# Patient Record
Sex: Male | Born: 1960 | Hispanic: Refuse to answer | Marital: Married | Smoking: Former smoker
Health system: Southern US, Community
[De-identification: ages and names within clinical notes are randomized; demographics above are authoritative.]

## PROBLEM LIST (undated history)

## (undated) DIAGNOSIS — E785 Hyperlipidemia, unspecified: Secondary | ICD-10-CM

## (undated) DIAGNOSIS — T8859XA Other complications of anesthesia, initial encounter: Secondary | ICD-10-CM

## (undated) DIAGNOSIS — T7840XA Allergy, unspecified, initial encounter: Secondary | ICD-10-CM

## (undated) DIAGNOSIS — R7989 Other specified abnormal findings of blood chemistry: Secondary | ICD-10-CM

## (undated) DIAGNOSIS — I10 Essential (primary) hypertension: Secondary | ICD-10-CM

## (undated) DIAGNOSIS — R9431 Abnormal electrocardiogram [ECG] [EKG]: Secondary | ICD-10-CM

## (undated) DIAGNOSIS — R778 Other specified abnormalities of plasma proteins: Secondary | ICD-10-CM

## (undated) HISTORY — PX: HERNIA REPAIR: SHX51

## (undated) HISTORY — DX: Hyperlipidemia, unspecified: E78.5

## (undated) HISTORY — PX: KNEE CARTILAGE SURGERY: SHX688

## (undated) HISTORY — DX: Allergy, unspecified, initial encounter: T78.40XA

## (undated) HISTORY — PX: SPINE SURGERY: SHX786

## (undated) HISTORY — PX: WISDOM TOOTH EXTRACTION: SHX21

---

## 2005-02-10 HISTORY — PX: OTHER SURGICAL HISTORY: SHX169

## 2015-12-03 ENCOUNTER — Encounter (HOSPITAL_COMMUNITY): Payer: Self-pay

## 2015-12-03 ENCOUNTER — Emergency Department (HOSPITAL_COMMUNITY)
Admission: EM | Admit: 2015-12-03 | Discharge: 2015-12-04 | Disposition: A | Discharge status: Home / Self Care | Payer: TRICARE For Life (TFL) | Attending: Emergency Medicine | Admitting: Emergency Medicine

## 2015-12-03 ENCOUNTER — Emergency Department (HOSPITAL_COMMUNITY): Payer: TRICARE For Life (TFL)

## 2015-12-03 DIAGNOSIS — I10 Essential (primary) hypertension: Secondary | ICD-10-CM | POA: Diagnosis not present

## 2015-12-03 DIAGNOSIS — S30811A Abrasion of abdominal wall, initial encounter: Secondary | ICD-10-CM | POA: Insufficient documentation

## 2015-12-03 DIAGNOSIS — Y939 Activity, unspecified: Secondary | ICD-10-CM | POA: Insufficient documentation

## 2015-12-03 DIAGNOSIS — Y999 Unspecified external cause status: Secondary | ICD-10-CM | POA: Diagnosis not present

## 2015-12-03 DIAGNOSIS — W1789XA Other fall from one level to another, initial encounter: Secondary | ICD-10-CM | POA: Diagnosis not present

## 2015-12-03 DIAGNOSIS — Y92009 Unspecified place in unspecified non-institutional (private) residence as the place of occurrence of the external cause: Secondary | ICD-10-CM | POA: Insufficient documentation

## 2015-12-03 DIAGNOSIS — W19XXXA Unspecified fall, initial encounter: Secondary | ICD-10-CM

## 2015-12-03 DIAGNOSIS — M25571 Pain in right ankle and joints of right foot: Secondary | ICD-10-CM

## 2015-12-03 HISTORY — DX: Essential (primary) hypertension: I10

## 2015-12-03 HISTORY — DX: Abnormal electrocardiogram (ECG) (EKG): R94.31

## 2015-12-03 HISTORY — DX: Other specified abnormalities of plasma proteins: R77.8

## 2015-12-03 HISTORY — DX: Other specified abnormal findings of blood chemistry: R79.89

## 2015-12-03 NOTE — ED Triage Notes (Signed)
Pt states she fell through 14 ft ceiling landing on  Right ankle/hill; pt c/o of right back pain, right butt pain and right ankle pain; pt c/o pain at 5/10 on arrival; pt denies any other injures or hitting head; Pt a&ox 4 on arrival; pt speaking in full and complete sentences;

## 2015-12-04 ENCOUNTER — Emergency Department (HOSPITAL_COMMUNITY): Payer: TRICARE For Life (TFL)

## 2015-12-04 LAB — URINALYSIS, ROUTINE W REFLEX MICROSCOPIC
BILIRUBIN URINE: NEGATIVE
Glucose, UA: NEGATIVE mg/dL
HGB URINE DIPSTICK: NEGATIVE
KETONES UR: NEGATIVE mg/dL
Leukocytes, UA: NEGATIVE
NITRITE: NEGATIVE
Protein, ur: NEGATIVE mg/dL
Specific Gravity, Urine: 1.023 (ref 1.005–1.030)
pH: 6 (ref 5.0–8.0)

## 2015-12-04 MED ORDER — HYDROCODONE-ACETAMINOPHEN 5-325 MG PO TABS: 1.0000 | ORAL_TABLET | ORAL | 0 refills | Status: DC | PRN

## 2015-12-04 NOTE — ED Provider Notes (Signed)
[facility]-EMERGENCY DEPT Provider Note   CSN: [id number] Arrival date & time: 12/03/15  2210     History   Chief Complaint Chief Complaint  Patient presents with  . Fall  . Ankle Injury    HPI Gregory Hunter is a 55 y.o. male.  Patient with history of HTN presents after a fall at home where he was in his attic and he fell between the rafter, through the ceiling, with impact in a standing position to the floor approximately 14 feet below. He complains of right ankle pain, right flank abrasion and sharp, right buttock pain. He did not hit his head. No chest, neck or abdominal pain. He is not anticoagulated.   The history is provided by the patient. No language interpreter was used.  Fall  Pertinent negatives include no chest pain, no abdominal pain, no headaches and no shortness of breath.  Ankle Injury  Pertinent negatives include no chest pain, no abdominal pain, no headaches and no shortness of breath.    Past Medical History:  Diagnosis Date  . Abnormal EKG   . Elevated troponin   . Hypertension     There are no active problems to display for this patient.   Past Surgical History:  Procedure Laterality Date  . back fusion  2007       Home Medications    Prior to Admission medications   Medication Sig Start Date End Date Taking? Authorizing Provider  amLODipine (NORVASC) 10 MG tablet Take 10 mg by mouth daily.   Yes Historical Provider, MD  diclofenac (VOLTAREN) 75 MG EC tablet Take 75 mg by mouth 2 (two) times daily.   Yes Historical Provider, MD  losartan (COZAAR) 50 MG tablet Take 50 mg by mouth daily.   Yes Historical Provider, MD    Family History No family history on file.  Social History Social History  Substance Use Topics  . Smoking status: Never Smoker  . Smokeless tobacco: Not on file  . Alcohol use Yes     Allergies   Review of patient's allergies indicates no known allergies.   Review of Systems Review of Systems  Constitutional: Negative  for chills and fever.  Respiratory: Negative.  Negative for shortness of breath.   Cardiovascular: Negative.  Negative for chest pain.  Gastrointestinal: Negative.  Negative for abdominal pain and nausea.  Genitourinary: Positive for flank pain. Negative for hematuria.  Musculoskeletal: Negative for back pain and neck pain.       See HPI.  Skin: Positive for wound (Right flank abrasion).  Neurological: Negative.  Negative for syncope, light-headedness and headaches.     Physical Exam Updated Vital Signs BP 136/90 (BP Location: Right Arm)   Pulse 74   Temp 98 F (36.7 C) (Oral)   Resp 20   Ht 5\' 11"  (1.803 m)   Wt 90.7 kg   SpO2 98%   BMI 27.89 kg/m   Physical Exam  Constitutional: He is oriented to person, place, and time. He appears well-developed and well-nourished.  HENT:  Head: Normocephalic and atraumatic.  Neck: Normal range of motion. Neck supple.  Cardiovascular: Normal rate.   Pulmonary/Chest: Effort normal. He exhibits no tenderness.  Abdominal: Soft. Bowel sounds are normal. There is no tenderness. There is no rebound and no guarding.  Musculoskeletal: Normal range of motion.  Right ankle is minimally swollen and is without deformity. There is increased pain with joint stability testing. Posterior tendon intact. No midline or para-lumbar tenderness.   Neurological: He is  alert and oriented to person, place, and time. He exhibits normal muscle tone. Coordination normal.  Skin: Skin is warm and dry. No rash noted.  Superficial abrasion right flank that is tender.   Psychiatric: He has a normal mood and affect.     ED Treatments / Results  Labs (all labs ordered are listed, but only abnormal results are displayed) Labs Reviewed  URINALYSIS, ROUTINE W REFLEX MICROSCOPIC (NOT AT [facility])    EKG  EKG Interpretation None       Radiology Dg Ankle Complete Right  Result Date: 12/03/2015 CLINICAL DATA:  Larey Seat 14 feet from ceiling tonight. Lateral ankle pain  and swelling. EXAM: RIGHT ANKLE - COMPLETE 3+ VIEW COMPARISON:  None. FINDINGS: No fracture deformity nor dislocation. The ankle mortise appears congruent and the tibiofibular syndesmosis intact. No destructive bony lesions. Mild lateral ankle soft tissue swelling without subcutaneous gas or radiopaque foreign bodies. IMPRESSION: Soft tissue swelling, no acute osseous process. Electronically Signed   By: Awilda Metro M.D.   On: 12/03/2015 23:28    Procedures Procedures (including critical care time)  Medications Ordered in ED Medications - No data to display   Initial Impression / Assessment and Plan / ED Course  I have reviewed the triage vital signs and the nursing notes.  Pertinent labs & imaging results that were available during my care of the patient were reviewed by me and considered in my medical decision making (see chart for details).  Clinical Course    Patient presents after approximate 14 foot fall from ceiling while in his attic. Complains of right ankle pain, right buttock pain and abrasion to right flank.   UA performed to r/o hematuria and is negative. Plain film ankle is negative, but given injury and tenderness, suspect occult fracture is possible. ASO splint and crutches provided and instructions to be non-weight bearing until evaluated by orthopedics. Lumbar plain films are negative for compression fractures or other acute abnormalities.   Patient has declined pain medication here. Will discharge home with pain management and ortho referral.   Final Clinical Impressions(s) / ED Diagnoses   Final diagnoses:  None   1. Fall 2. Ankle pain, right 3. Abrasion right flank  New Prescriptions New Prescriptions   No medications on file     Elpidio Anis, Cordelia Poche 12/04/15 0323    Dione Booze, MD 12/04/15 [phone]

## 2015-12-04 NOTE — ED Notes (Signed)
Patient transported to X-ray 

## 2015-12-04 NOTE — ED Notes (Signed)
Pt back from x-ray.

## 2016-04-03 ENCOUNTER — Telehealth: Payer: Self-pay | Admitting: Cardiology

## 2016-04-03 NOTE — Telephone Encounter (Signed)
Records received from [facility] for apt on 04/21/16 with Dr Antoine PocheHochrein. CN

## 2016-04-11 ENCOUNTER — Encounter: Payer: Self-pay | Admitting: Cardiology

## 2016-04-19 NOTE — Progress Notes (Signed)
Cardiology Office Note   Date:  04/21/2016   ID:  Gregory Hunter, DOB 03/19/1960, MRN [id number]  PCP:  No PCP Per Patient  Cardiologist:   Rollene RotundaJames Rexton Greulich, MD  Referring:  Self  No chief complaint on file.     History of Present Illness: Gregory Hunter is a 56 y.o. male who presents for abnormal EKG. He has had this noted when he was in the [employer]. At some point along the way he did have a cardiac catheterization which he says was normal.  He did have a treadmill perfusion study in December 2008 demonstrated normal perfusion is a normal EF. Echocardiogram showed mild LVH that time. He had a negative POET (Plain Old Exercise Treadmill) in 2012. Echocardiogram has demonstrated some mild mitral regurgitation in 2012.  He is quite active and athletic. With this he denies any cardiovascular symptoms. The patient denies any new symptoms such as chest discomfort, neck or arm discomfort. There has been no new shortness of breath, PND or orthopnea. There have been no reported palpitations, presyncope or syncope.  Of note he also says he's had elevated troponins in the past with an unclear reason and perhaps hypertension.   Past Medical History:  Diagnosis Date  . Abnormal EKG   . Elevated troponin   . Hypertension     Past Surgical History:  Procedure Laterality Date  . back fusion  2007     Current Outpatient Prescriptions  Medication Sig Dispense Refill  . amLODipine (NORVASC) 10 MG tablet Take 1 tablet (10 mg total) by mouth daily. 90 tablet 3  . diclofenac (VOLTAREN) 75 MG EC tablet Take 75 mg by mouth 2 (two) times daily.    Marland Kitchen. losartan (COZAAR) 50 MG tablet Take 1 tablet (50 mg total) by mouth daily. 90 tablet 3   No current facility-administered medications for this visit.     Allergies:   Patient has no known allergies.    Social History:  The patient  reports that he has quit smoking. His smoking use included Cigarettes. He has never used smokeless tobacco. He reports that he  drinks alcohol. He reports that he does not use drugs.   Family History:  The patient's family history is not on file.    ROS:  Please see the history of present illness.   Otherwise, review of systems are positive for none.   All other systems are reviewed and negative.    PHYSICAL EXAM: VS:  BP (!) 131/92 (BP Location: Left Arm, Patient Position: Sitting, Cuff Size: Normal)   Pulse 62   Ht 5\' 11"  (1.803 m)   Wt 206 lb 9.6 oz (93.7 kg)   BMI 28.81 kg/m  , BMI Body mass index is 28.81 kg/m. GENERAL:  Well appearing HEENT:  Pupils equal round and reactive, fundi not visualized, oral mucosa unremarkable NECK:  No jugular venous distention, waveform within normal limits, carotid upstroke brisk and symmetric, no bruits, no thyromegaly LYMPHATICS:  No cervical, inguinal adenopathy LUNGS:  Clear to auscultation bilaterally BACK:  No CVA tenderness CHEST:  Unremarkable HEART:  PMI not displaced or sustained,S1 and S2 within normal limits, no S3, no S4, no clicks, no rubs, no murmurs ABD:  Flat, positive bowel sounds normal in frequency in pitch, no bruits, no rebound, no guarding, no midline pulsatile mass, no hepatomegaly, no splenomegaly EXT:  2 plus pulses throughout, no edema, no cyanosis no clubbing SKIN:  No rashes no nodules NEURO:  Cranial nerves II through XII grossly  intact, motor grossly intact throughout PSYCH:  Cognitively intact, oriented to person place and time    EKG:  EKG is ordered today. The ekg ordered today demonstrates sinus rhythm, rate 62, axis within normal limits, intervals within normal limits, inferolateral T wave inversions unchanged from previous.   Recent Labs: No results found for requested labs within last 8760 hours.    Lipid Panel No results found for: CHOL, TRIG, HDL, CHOLHDL, VLDL, LDLCALC, LDLDIRECT    Wt Readings from Last 3 Encounters:  04/21/16 206 lb 9.6 oz (93.7 kg)  12/03/15 200 lb (90.7 kg)      Other studies  Reviewed: Additional studies/ records that were reviewed today include: Outside cardiology records. Review of the above records demonstrates:  Please see elsewhere in the note.     ASSESSMENT AND PLAN:  Hypertension:  BP is borderline.  He will keep a BP diary.  For now he will continue the meds as listed.   Hyperlipidemia:   His LDL last year was 175. We discussed this. He's had a pursue a Mediterranean diet and come back in 2 months for a fasting lipid profile. If he still elevated will discuss taking statin although likely to want to be guided by CT for coronary calcium.  Abnormal EKG:  This seems to be his baseline. He's had extensive workup. This may be related to LVH. No further evaluation is indicated. He can continue with aggressive primary risk reduction.     Current medicines are reviewed at length with the patient today.  The patient does not have concerns regarding medicines.  The following changes have been made:  no change  Labs/ tests ordered today include:   Orders Placed This Encounter  Procedures  . Hepatic function panel  . Lipid panel     Disposition:   FU with in two years.     Signed, Rollene Rotunda, MD  04/21/2016 4:46 PM    [facility]

## 2016-04-21 ENCOUNTER — Encounter: Payer: Self-pay | Admitting: Cardiology

## 2016-04-21 ENCOUNTER — Other Ambulatory Visit: Payer: Self-pay

## 2016-04-21 ENCOUNTER — Ambulatory Visit (INDEPENDENT_AMBULATORY_CARE_PROVIDER_SITE_OTHER): Admitting: Cardiology

## 2016-04-21 VITALS — BP 131/92 | HR 62 | Ht 71.0 in | Wt 206.6 lb

## 2016-04-21 DIAGNOSIS — Z79899 Other long term (current) drug therapy: Secondary | ICD-10-CM

## 2016-04-21 DIAGNOSIS — E785 Hyperlipidemia, unspecified: Secondary | ICD-10-CM

## 2016-04-21 DIAGNOSIS — R9431 Abnormal electrocardiogram [ECG] [EKG]: Secondary | ICD-10-CM | POA: Diagnosis not present

## 2016-04-21 MED ORDER — AMLODIPINE BESYLATE 10 MG PO TABS: 10.0000 mg | ORAL_TABLET | Freq: Every day | ORAL | 3 refills | Status: DC

## 2016-04-21 MED ORDER — LOSARTAN POTASSIUM 50 MG PO TABS: 50.0000 mg | ORAL_TABLET | Freq: Every day | ORAL | 3 refills | Status: DC

## 2016-04-21 NOTE — Patient Instructions (Addendum)
Medication Instructions:  Continue current medications  Labwork: Fasting Lipids Liver in 2 Months  Testing/Procedures: None Ordered  Follow-Up: Your physician wants you to follow-up in: 2 Years. You will receive a reminder letter in the mail two months in advance. If you don't receive a letter, please call our office to schedule the follow-up appointment.   Any Other Special Instructions Will Be Listed Below (If Applicable).   If you need a refill on your cardiac medications before your next appointment, please call your pharmacy.

## 2016-04-23 NOTE — Addendum Note (Signed)
Addended by: Beryle QuantBENSHINE, Cheronda Erck L on: 04/23/2016 08:31 AM   Modules accepted: Orders

## 2016-11-10 ENCOUNTER — Other Ambulatory Visit: Payer: Self-pay | Admitting: Physical Medicine and Rehabilitation

## 2016-11-10 DIAGNOSIS — M542 Cervicalgia: Secondary | ICD-10-CM

## 2016-11-20 ENCOUNTER — Ambulatory Visit
Admission: RE | Admit: 2016-11-20 | Discharge: 2016-11-20 | Disposition: A | Discharge status: Home / Self Care | Source: Ambulatory Visit | Attending: Physical Medicine and Rehabilitation | Admitting: Physical Medicine and Rehabilitation

## 2016-11-20 DIAGNOSIS — M542 Cervicalgia: Secondary | ICD-10-CM

## 2016-11-20 MED ORDER — TRIAMCINOLONE ACETONIDE 40 MG/ML IJ SUSP (RADIOLOGY)
60.0000 mg | Freq: Once | INTRAMUSCULAR | Status: AC
  Administered 2016-11-20: 60 mg via EPIDURAL

## 2016-11-20 MED ORDER — IOPAMIDOL (ISOVUE-M 300) INJECTION 61%
1.0000 mL | Freq: Once | INTRAMUSCULAR | Status: AC | PRN
  Administered 2016-11-20: 1 mL via EPIDURAL

## 2016-11-20 NOTE — Discharge Instructions (Signed)

## 2017-03-13 ENCOUNTER — Other Ambulatory Visit: Payer: Self-pay | Admitting: Physical Medicine and Rehabilitation

## 2017-03-13 DIAGNOSIS — M542 Cervicalgia: Secondary | ICD-10-CM

## 2017-06-16 ENCOUNTER — Ambulatory Visit: Admitting: Cardiology

## 2017-06-23 ENCOUNTER — Encounter: Payer: Self-pay | Admitting: Cardiology

## 2017-07-08 ENCOUNTER — Telehealth: Payer: Self-pay | Admitting: Cardiology

## 2017-07-08 NOTE — Telephone Encounter (Signed)
Patient made aware that he has pending fasting labs. He will come tomorrow to have these done.

## 2017-07-08 NOTE — Telephone Encounter (Signed)
New Message:       Pt is wondering if he is needing labs done before his appt on 07/16/17

## 2017-07-09 ENCOUNTER — Other Ambulatory Visit: Payer: Self-pay | Admitting: *Deleted

## 2017-07-09 DIAGNOSIS — Z79899 Other long term (current) drug therapy: Secondary | ICD-10-CM

## 2017-07-09 DIAGNOSIS — E785 Hyperlipidemia, unspecified: Secondary | ICD-10-CM

## 2017-07-09 LAB — HEPATIC FUNCTION PANEL
ALBUMIN: 4.5 g/dL (ref 3.5–5.5)
ALK PHOS: 50 IU/L (ref 39–117)
ALT: 21 IU/L (ref 0–44)
AST: 20 IU/L (ref 0–40)
BILIRUBIN, DIRECT: 0.14 mg/dL (ref 0.00–0.40)
Bilirubin Total: 0.7 mg/dL (ref 0.0–1.2)
Total Protein: 6.6 g/dL (ref 6.0–8.5)

## 2017-07-09 LAB — LIPID PANEL
CHOL/HDL RATIO: 3.6 ratio (ref 0.0–5.0)
Cholesterol, Total: 200 mg/dL — ABNORMAL HIGH (ref 100–199)
HDL: 56 mg/dL (ref >39)
LDL Calculated: 122 mg/dL — ABNORMAL HIGH (ref 0–99)
TRIGLYCERIDES: 108 mg/dL (ref 0–149)
VLDL Cholesterol Cal: 22 mg/dL (ref 5–40)

## 2017-07-09 NOTE — Progress Notes (Signed)
Pt did get lab work when it was order 1 year ago, pt came in today to get blood work drawn with the same lab slip given to him on 03/18, lab had to be reorder

## 2017-07-13 ENCOUNTER — Ambulatory Visit: Admitting: Cardiology

## 2017-07-13 ENCOUNTER — Telehealth: Payer: Self-pay | Admitting: Cardiology

## 2017-07-13 DIAGNOSIS — R0602 Shortness of breath: Secondary | ICD-10-CM

## 2017-07-13 NOTE — Telephone Encounter (Signed)
Pt aware of his blood work Coronary calcium schedule for Thursday June 6th @ 8:15 am, pt appt change to Monday June 10th @ 8:00

## 2017-07-13 NOTE — Telephone Encounter (Signed)
New Message   Patient returning your call from Friday

## 2017-07-13 NOTE — Telephone Encounter (Signed)
-----   Message from Rollene RotundaJames Hochrein, MD sent at 07/10/2017  1:02 PM EDT ----- LDL is still elevated.  I think a coronary calcium score would be helpful to guide therapy.  Call Mr. Jose PersiaKivi with the results.

## 2017-07-16 ENCOUNTER — Other Ambulatory Visit: Payer: Self-pay | Admitting: Cardiology

## 2017-07-16 ENCOUNTER — Ambulatory Visit (INDEPENDENT_AMBULATORY_CARE_PROVIDER_SITE_OTHER)
Admission: RE | Admit: 2017-07-16 | Discharge: 2017-07-16 | Disposition: A | Discharge status: Home / Self Care | Payer: Self-pay | Source: Ambulatory Visit | Attending: Cardiology | Admitting: Cardiology

## 2017-07-16 ENCOUNTER — Ambulatory Visit: Admitting: Cardiology

## 2017-07-16 DIAGNOSIS — R0602 Shortness of breath: Secondary | ICD-10-CM

## 2017-07-18 NOTE — Progress Notes (Signed)
Cardiology Office Note   Date:  07/20/2017   ID:  Gregory Hunter, DOB [birth year], MRN [id number]  PCP:  Patient, No Pcp Per  Cardiologist:   Rollene Rotunda, MD     Chief Complaint  Patient presents with  . Abnormal ECG      History of Present Illness: Gregory Hunter is a 57 y.o. male who presents for abnormal EKG. He has had this noted when he was in the [employer]. At some point along the way he did have a cardiac catheterization which he says was normal.  He did have a treadmill perfusion study in December 2008 demonstrated normal perfusion is a normal EF. Echocardiogram showed mild LVH that time. He had a negative POET (Plain Old Exercise Treadmill) in 2012. Echocardiogram has demonstrated some mild mitral regurgitation in 2012.   I sent him for a coronary calcium last week that was 140 and at the 82nd%.  He returns to discuss this.    Since I last saw him he has done well.  He exercises at least 75 minutes vigorously weekly.  The patient denies any new symptoms such as chest discomfort, neck or arm discomfort. There has been no new shortness of breath, PND or orthopnea. There have been no reported palpitations, presyncope or syncope.   Past Medical History:  Diagnosis Date  . Abnormal EKG   . Elevated troponin   . Hypertension     Past Surgical History:  Procedure Laterality Date  . back fusion  2007     Current Outpatient Medications  Medication Sig Dispense Refill  . amLODipine (NORVASC) 10 MG tablet TAKE 1 TABLET DAILY 90 tablet 3  . diclofenac (VOLTAREN) 75 MG EC tablet Take 75 mg by mouth 2 (two) times daily.    Marland Kitchen losartan (COZAAR) 50 MG tablet Take 1 tablet (50 mg total) by mouth daily. 90 tablet 3  . pravastatin (PRAVACHOL) 80 MG tablet Take 1 tablet (80 mg total) by mouth daily. 90 tablet 3   No current facility-administered medications for this visit.     Allergies:   Patient has no known allergies.    ROS:  Please see the history of present illness.   Otherwise,  review of systems are positive for none.   All other systems are reviewed and negative.    PHYSICAL EXAM: VS:  BP 110/80   Pulse 73   Ht 5' 10.5" (1.791 m)   Wt 202 lb (91.6 kg)   BMI 28.57 kg/m  , BMI Body mass index is 28.57 kg/m.  GENERAL:  Well appearing NECK:  No jugular venous distention, waveform within normal limits, carotid upstroke brisk and symmetric, no bruits, no thyromegaly LUNGS:  Clear to auscultation bilaterally CHEST:  Unremarkable HEART:  PMI not displaced or sustained,S1 and S2 within normal limits, no S3, no S4, no clicks, no rubs, no murmurs ABD:  Flat, positive bowel sounds normal in frequency in pitch, no bruits, no rebound, no guarding, no midline pulsatile mass, no hepatomegaly, no splenomegaly EXT:  2 plus pulses throughout, no edema, no cyanosis no clubbing   EKG:  EKG is  ordered today. The ekg ordered today demonstrates sinus rhythm, rate 73 , axis within normal limits, intervals within normal limits, inferolateral T wave inversions less pronounced than previous.   Recent Labs: 07/09/2017: ALT 21    Lipid Panel    Component Value Date/Time   CHOL 200 (H) 07/09/2017 1031   TRIG 108 07/09/2017 1031   HDL 56 07/09/2017 1031  CHOLHDL 3.6 07/09/2017 1031   LDLCALC 122 (H) 07/09/2017 1031      Wt Readings from Last 3 Encounters:  07/20/17 202 lb (91.6 kg)  04/21/16 206 lb 9.6 oz (93.7 kg)  12/03/15 200 lb (90.7 kg)      Other studies Reviewed: Additional studies/ records that were reviewed today include: Coronary calcium CT Review of the above records demonstrates:  As above   ASSESSMENT AND PLAN:   Elevated coronary calcium:   His MESA score is 10.5% with the coronary calcium added.  He is now taking Pravachol 40 mg and as he is not at target heart rate I will increase this to 80 mg and check a lipid profile in 10 weeks.   Hypertension:  BP is at target.  No change in therapy.   Hyperlipidemia:   His LDL was 122 with an HDL of 56.   This will be treated as above.    last year was 175.   Abnormal EKG:    T wave inversion is less pronounced.  No change in therapy.   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  I will follow this with imaging in one year.         Current medicines are reviewed at length with the patient today.  The patient does not have concerns regarding medicines.  The following changes have been made:  As above  Labs/ tests ordered today include:     Orders Placed This Encounter  Procedures  . Lipid panel  . EXERCISE TOLERANCE TEST (ETT)  . EKG 12-Lead     Disposition:   FU with me in one year.      Signed, Rollene RotundaJames Ronith Berti, MD  07/20/2017 8:22 AM    [facility]

## 2017-07-20 ENCOUNTER — Encounter: Payer: Self-pay | Admitting: Cardiology

## 2017-07-20 ENCOUNTER — Ambulatory Visit (INDEPENDENT_AMBULATORY_CARE_PROVIDER_SITE_OTHER): Admitting: Cardiology

## 2017-07-20 VITALS — BP 110/80 | HR 73 | Ht 70.5 in | Wt 202.0 lb

## 2017-07-20 DIAGNOSIS — I10 Essential (primary) hypertension: Secondary | ICD-10-CM | POA: Diagnosis not present

## 2017-07-20 DIAGNOSIS — R931 Abnormal findings on diagnostic imaging of heart and coronary circulation: Secondary | ICD-10-CM | POA: Diagnosis not present

## 2017-07-20 DIAGNOSIS — E785 Hyperlipidemia, unspecified: Secondary | ICD-10-CM

## 2017-07-20 DIAGNOSIS — R9431 Abnormal electrocardiogram [ECG] [EKG]: Secondary | ICD-10-CM

## 2017-07-20 MED ORDER — PRAVASTATIN SODIUM 80 MG PO TABS: 80.0000 mg | ORAL_TABLET | Freq: Every day | ORAL | 3 refills | Status: DC

## 2017-07-20 NOTE — Patient Instructions (Signed)
Medication Instructions:  INCREASE- Pravastatin 80 mg daily  If you need a refill on your cardiac medications before your next appointment, please call your pharmacy.  Labwork: Fasting Lipids in 10 weeks HERE IN OUR OFFICE AT [facility]  Take the provided lab slips with you to the lab for your blood draw.   You will need to fast. DO NOT EAT OR DRINK PAST MIDNIGHT.   Testing/Procedures: Your physician has requested that you have an exercise tolerance test. For further information please visit [email or web address] Please also follow instruction sheet, as given.  Follow-Up: Your physician wants you to follow-up in: 1 Year. You should receive a reminder letter in the mail two months in advance. If you do not receive a letter, please call our office [phone].      [facility] you [facility] choosing [facility]!!

## 2017-07-24 ENCOUNTER — Telehealth (HOSPITAL_COMMUNITY): Payer: Self-pay

## 2017-07-24 NOTE — Telephone Encounter (Signed)
Encounter complete. 

## 2017-07-29 ENCOUNTER — Ambulatory Visit (HOSPITAL_COMMUNITY)
Admission: RE | Admit: 2017-07-29 | Discharge: 2017-07-29 | Disposition: A | Discharge status: Home / Self Care | Source: Ambulatory Visit | Attending: Cardiovascular Disease | Admitting: Cardiovascular Disease

## 2017-07-29 DIAGNOSIS — R9431 Abnormal electrocardiogram [ECG] [EKG]: Secondary | ICD-10-CM | POA: Diagnosis not present

## 2017-07-29 LAB — EXERCISE TOLERANCE TEST
CHL CUP MPHR: 164 {beats}/min
CHL CUP RESTING HR STRESS: 67 {beats}/min
CSEPPHR: 164 {beats}/min
Estimated workload: 13.4 METS
Exercise duration (min): 12 min
Exercise duration (sec): 0 s
Percent HR: 100 %
RPE: 16

## 2017-09-25 ENCOUNTER — Encounter: Payer: Self-pay | Admitting: Cardiology

## 2017-10-07 ENCOUNTER — Telehealth: Payer: Self-pay

## 2017-10-07 NOTE — Telephone Encounter (Signed)
Spoke to patient Dr.Hochrein advised ok to start taking Zetia that Dr.Patterson prescribed.Stated he will fill prescription today.

## 2018-02-08 MED ORDER — AMLODIPINE BESYLATE 10 MG PO TABS: 10.0000 mg | ORAL_TABLET | Freq: Every day | ORAL | 2 refills | Status: DC

## 2018-02-08 MED ORDER — LOSARTAN POTASSIUM 50 MG PO TABS: 50.0000 mg | ORAL_TABLET | Freq: Every day | ORAL | 2 refills | Status: DC

## 2018-05-11 ENCOUNTER — Other Ambulatory Visit: Payer: Self-pay

## 2018-05-11 MED ORDER — PRAVASTATIN SODIUM 80 MG PO TABS: 80.0000 mg | ORAL_TABLET | Freq: Every day | ORAL | 0 refills | Status: DC

## 2018-05-11 MED ORDER — AMLODIPINE BESYLATE 10 MG PO TABS: 10.0000 mg | ORAL_TABLET | Freq: Every day | ORAL | 0 refills | Status: DC

## 2018-05-11 MED ORDER — EZETIMIBE 10 MG PO TABS: 10.0000 mg | ORAL_TABLET | Freq: Every day | ORAL | 0 refills | Status: DC

## 2018-05-11 MED ORDER — LOSARTAN POTASSIUM 50 MG PO TABS: 50.0000 mg | ORAL_TABLET | Freq: Every day | ORAL | 0 refills | Status: DC

## 2018-05-11 NOTE — Telephone Encounter (Signed)
Refill Amlodipine, Losartan, Pravastatin and Zetia

## 2018-05-17 ENCOUNTER — Other Ambulatory Visit: Payer: Self-pay

## 2018-05-21 ENCOUNTER — Telehealth: Payer: Self-pay | Admitting: Cardiology

## 2018-05-21 NOTE — Telephone Encounter (Signed)
Smartphone/ my chart/ pre reg completed °

## 2018-05-23 NOTE — Progress Notes (Signed)
Virtual Visit via Video Note   This visit type was conducted due to national recommendations for restrictions regarding the COVID-19 Pandemic (e.g. social distancing) in an effort to limit this patient's exposure and mitigate transmission in our community.  Due to his co-morbid illnesses, this patient is at least at moderate risk for complications without adequate follow up.  This format is felt to be most appropriate for this patient at this time.  All issues noted in this document were discussed and addressed.  A limited physical exam was performed with this format.  Please refer to the patient's chart for his consent to telehealth for [facility].   Evaluation Performed:  Follow-up visit  Date:  05/24/2018   ID:  Gregory Hurtric Matney, DOB 03/14/1960, MRN [id number]  Patient Location: Home  Provider Location: Home  PCP:  Jarome MatinPaterson, Daniel, MD  Cardiologist:  Rollene RotundaJames Zyionna Pesce, MD  Electrophysiologist:  None   Chief Complaint:  Fatigue  History of Present Illness:    Gregory Hunter is a 58 y.o. male who presents via audio/video conferencing for a telehealth visit today.    He presents for follow up of an abnormal EKG. He has had this noted when he was in the [employer]. At some point along the way he did have a cardiac catheterization which he says was normal.  He did have a treadmill perfusion study in December 2008 demonstrated normal perfusion is a normal EF. Echocardiogram showed mild LVH that time. He had a negative POET (Plain Old Exercise Treadmill) in 2012. Echocardiogram has demonstrated some mild mitral regurgitation in 2012.   I sent him for a coronary calcium that was 140 and at the 82nd%.  He also has slight aortic enlargement.     Since I last saw him he has done quite well.  He was out biking recently and he is noticed that he gets lightheaded when he is done exercising.  He does not have chest pressure, neck or arm discomfort.  He does not have palpitations, presyncope or syncope.  He says  immediately after his been exercising he will feel lightheaded like when he is walking up the stairs any finishes or when he is getting off his bike.  He also sleeps more than he used to and wants to nap every day.    Since I last saw him he has done well.  He exercises at least 75 minutes vigorously weekly.  The patient denies any new symptoms such as chest discomfort, neck or arm discomfort. There has been no new shortness of breath, PND or orthopnea. There have been no reported palpitations, presyncope or syncope.   The patient does not have symptoms concerning for COVID-19 infection (fever, chills, cough, or new shortness of breath).    Past Medical History:  Diagnosis Date  . Abnormal EKG   . Elevated troponin   . Hypertension    Past Surgical History:  Procedure Laterality Date  . back fusion  2007     Current Meds  Medication Sig  . amLODipine (NORVASC) 10 MG tablet Take 1 tablet (10 mg total) by mouth daily.  . diclofenac (VOLTAREN) 75 MG EC tablet Take 75 mg by mouth 2 (two) times daily.  Marland Kitchen. ezetimibe (ZETIA) 10 MG tablet Take 1 tablet (10 mg total) by mouth daily.  Marland Kitchen. losartan (COZAAR) 50 MG tablet Take 1 tablet (50 mg total) by mouth daily.  . pravastatin (PRAVACHOL) 80 MG tablet Take 1 tablet (80 mg total) by mouth daily.  . [DISCONTINUED]  amLODipine (NORVASC) 10 MG tablet Take 1 tablet (10 mg total) by mouth daily.  . [DISCONTINUED] ezetimibe (ZETIA) 10 MG tablet Take 1 tablet (10 mg total) by mouth daily.  . [DISCONTINUED] losartan (COZAAR) 50 MG tablet Take 1 tablet (50 mg total) by mouth daily.  . [DISCONTINUED] pravastatin (PRAVACHOL) 80 MG tablet Take 1 tablet (80 mg total) by mouth daily.     Allergies:   Patient has no known allergies.   Social History   Tobacco Use  . Smoking status: Former Smoker    Types: Cigarettes  . Smokeless tobacco: Never Used  Substance Use Topics  . Alcohol use: Yes  . Drug use: No     Family Hx: The patient's family history  is not on file.  ROS:   Please see the history of present illness.    As above All other systems reviewed and are negative.   Prior CV studies:   The following studies were reviewed today:  Labs  Labs/Other Tests and Data Reviewed:    EKG:  No ECG reviewed.  Recent Labs: 07/09/2017: ALT 21   Recent Lipid Panel Lab Results  Component Value Date/Time   CHOL 200 (H) 07/09/2017 10:31 AM   TRIG 108 07/09/2017 10:31 AM   HDL 56 07/09/2017 10:31 AM   CHOLHDL 3.6 07/09/2017 10:31 AM   LDLCALC 122 (H) 07/09/2017 10:31 AM    Wt Readings from Last 3 Encounters:  05/24/18 205 lb (93 kg)  07/20/17 202 lb (91.6 kg)  04/21/16 206 lb 9.6 oz (93.7 kg)     Objective:    Vital Signs:  BP 126/88   Pulse 74   Ht  (1.803 m)   Wt 205 lb (93 kg)   BMI 28.59 kg/m    Well nourished, well developed male in no  acute distress.   ASSESSMENT & PLAN:    Elevated coronary calcium:      His MESA score is 10.5% with the coronary calcium added.    He started Crestor and his LDL is 117.  At this point he wants to check this again I will do this in 1 month and if not he might agree to start Crestor for target LDL of 70.  Hypertension:  BP is at target.  No change in therapy.   Hyperlipidemia:   LDL in August was 117.  HDL 56 and total 192.     As above.  We also talked about diet and exercise.  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 he will have a follow-up in August.   COVID-19 Education: The signs and symptoms of COVID-19 were discussed with the patient and how to seek care for testing (follow up with PCP or arrange E-visit).  The importance of social distancing was discussed today.  We talked quite a bit about this because he is still working and we talked about how the virus might spread in droplets and also slightly aerosolized.  He is going to take this on advisement and consider changing his current paradigm.  Time:   Today, I have spent 20 minutes with the patient with  telehealth technology discussing the above problems.     Medication Adjustments/Labs and Tests Ordered: Current medicines are reviewed at length with the patient today.  Concerns regarding medicines are outlined above.   Tests Ordered: Orders Placed This Encounter  Procedures  . CT ANGIO CHEST AORTA W/CM &/OR WO/CM  . Lipid panel   Medication Changes: Meds ordered  this encounter  Medications  . amLODipine (NORVASC) 10 MG tablet    Sig: Take 1 tablet (10 mg total) by mouth daily.    Dispense:  90 tablet    Refill:  3  . ezetimibe (ZETIA) 10 MG tablet    Sig: Take 1 tablet (10 mg total) by mouth daily.    Dispense:  90 tablet    Refill:  3  . losartan (COZAAR) 50 MG tablet    Sig: Take 1 tablet (50 mg total) by mouth daily.    Dispense:  90 tablet    Refill:  3  . pravastatin (PRAVACHOL) 80 MG tablet    Sig: Take 1 tablet (80 mg total) by mouth daily.    Dispense:  90 tablet    Refill:  3    Disposition:  Follow up one year.   Signed, Rollene Rotunda, MD  05/24/2018 10:15 AM    [facility]

## 2018-05-24 ENCOUNTER — Telehealth (INDEPENDENT_AMBULATORY_CARE_PROVIDER_SITE_OTHER): Admitting: Cardiology

## 2018-05-24 ENCOUNTER — Ambulatory Visit: Admitting: Cardiology

## 2018-05-24 ENCOUNTER — Other Ambulatory Visit: Payer: Self-pay

## 2018-05-24 VITALS — BP 126/88 | HR 74 | Ht 71.0 in | Wt 205.0 lb

## 2018-05-24 DIAGNOSIS — E785 Hyperlipidemia, unspecified: Secondary | ICD-10-CM

## 2018-05-24 DIAGNOSIS — I719 Aortic aneurysm of unspecified site, without rupture: Secondary | ICD-10-CM

## 2018-05-24 MED ORDER — EZETIMIBE 10 MG PO TABS: 10.0000 mg | ORAL_TABLET | Freq: Every day | ORAL | 3 refills | Status: AC

## 2018-05-24 MED ORDER — AMLODIPINE BESYLATE 10 MG PO TABS: 10.0000 mg | ORAL_TABLET | Freq: Every day | ORAL | 3 refills | Status: DC

## 2018-05-24 MED ORDER — PRAVASTATIN SODIUM 80 MG PO TABS: 80.0000 mg | ORAL_TABLET | Freq: Every day | ORAL | 3 refills | Status: DC

## 2018-05-24 MED ORDER — LOSARTAN POTASSIUM 50 MG PO TABS: 50.0000 mg | ORAL_TABLET | Freq: Every day | ORAL | 3 refills | Status: DC

## 2018-05-24 NOTE — Patient Instructions (Addendum)
Medication Instructions:  Continue current medications  If you need a refill on your cardiac medications before your next appointment, please call your pharmacy.  Labwork: Fasting Lipid in 1 Month HERE IN OUR OFFICE AT [facility]  You will need to fast. DO NOT EAT OR DRINK PAST MIDNIGHT.     Take the provided lab slips with you to the lab for your blood draw.   When you have your labs (blood work) drawn today and your tests are completely normal, you will receive your results only by MyChart Message (if you have MyChart) -OR-  A paper copy in the mail.  If you have any lab test that is abnormal or we need to change your treatment, we will call you to review these results.  Testing/Procedures: Non-Cardiac CT scanning in August, (CAT scanning), is a noninvasive, special x-ray that produces cross-sectional images of the body using x-rays and a computer. CT scans help physicians diagnose and treat medical conditions. For some CT exams, a contrast material is used to enhance visibility in the area of the body being studied. CT scans provide greater clarity and reveal more details than regular x-ray exams.  Follow-Up: You will need a follow up appointment in 1 Year.  Please call our office 2 months in advance to schedule this appointment.  You may see Rollene Rotunda, MD or one of the following Advanced Practice Providers on your designated Care Team:   Theodore Demark, PA-C . Joni Reining, DNP, ANP      At [facility], you and your health needs are our priority.  As part of our continuing mission to provide you with exceptional heart care, we have created designated Provider Care Teams.  These Care Teams include your primary Cardiologist (physician) and Advanced Practice Providers (APPs -  Physician Assistants and Nurse Practitioners) who all work together to provide you with the care you need, when you need it.  Thank you for choosing [facility] at [facility]!!

## 2018-07-02 LAB — LIPID PANEL
Chol/HDL Ratio: 2 ratio (ref 0.0–5.0)
Cholesterol, Total: 143 mg/dL (ref 100–199)
HDL: 72 mg/dL (ref >39)
LDL Calculated: 59 mg/dL (ref 0–99)
Triglycerides: 59 mg/dL (ref 0–149)
VLDL Cholesterol Cal: 12 mg/dL (ref 5–40)

## 2018-08-25 ENCOUNTER — Other Ambulatory Visit: Payer: Self-pay | Admitting: Cardiology

## 2018-09-17 ENCOUNTER — Telehealth: Payer: Self-pay

## 2018-09-17 DIAGNOSIS — Z01812 Encounter for preprocedural laboratory examination: Secondary | ICD-10-CM

## 2018-09-17 LAB — BASIC METABOLIC PANEL
BUN/Creatinine Ratio: 13 (ref 9–20)
BUN: 14 mg/dL (ref 6–24)
CO2: 27 mmol/L (ref 20–29)
Calcium: 9.7 mg/dL (ref 8.7–10.2)
Chloride: 101 mmol/L (ref 96–106)
Creatinine, Ser: 1.1 mg/dL (ref 0.76–1.27)
GFR calc Af Amer: 86 mL/min/{1.73_m2} (ref >59)
GFR calc non Af Amer: 74 mL/min/{1.73_m2} (ref >59)
Glucose: 94 mg/dL (ref 65–99)
Potassium: 4.3 mmol/L (ref 3.5–5.2)
Sodium: 138 mmol/L (ref 134–144)

## 2018-09-17 NOTE — Telephone Encounter (Signed)
Received a call from [place] in [place] she states that this pt did not have a CMET done for his upcoming CT scheduled Monday, order entered. Left detailed message for pt to come in to have STAT lab done today or else he cannot have CT done

## 2018-09-20 ENCOUNTER — Other Ambulatory Visit: Payer: Self-pay

## 2018-09-20 ENCOUNTER — Ambulatory Visit (INDEPENDENT_AMBULATORY_CARE_PROVIDER_SITE_OTHER)
Admission: RE | Admit: 2018-09-20 | Discharge: 2018-09-20 | Disposition: A | Discharge status: Home / Self Care | Source: Ambulatory Visit | Attending: Cardiology | Admitting: Cardiology

## 2018-09-20 DIAGNOSIS — I719 Aortic aneurysm of unspecified site, without rupture: Secondary | ICD-10-CM

## 2018-09-20 MED ORDER — IOHEXOL 350 MG/ML SOLN
100.0000 mL | Freq: Once | INTRAVENOUS | Status: AC | PRN
  Administered 2018-09-20: 100 mL via INTRAVENOUS

## 2018-09-21 ENCOUNTER — Encounter: Payer: Self-pay | Admitting: *Deleted

## 2018-09-21 ENCOUNTER — Telehealth: Payer: Self-pay | Admitting: *Deleted

## 2018-09-21 NOTE — Telephone Encounter (Signed)
Result Notes for CT ANGIO CHEST AORTA W/CM &/OR WO/CM  Notes recorded by Minus Breeding, MD on 09/21/2018 at 8:33 AM EDT  Aorta is stable at 4.2 cm. I will likely follow this up with repeat study in about two years. No other significant abnormalities. Call Mr. Grieder with the results and send results to Leanna Battles, MD

## 2018-09-23 NOTE — Telephone Encounter (Signed)
Sent to PC

## 2018-09-23 NOTE — Telephone Encounter (Signed)
Message with results reviewed in mychart

## 2018-11-09 ENCOUNTER — Other Ambulatory Visit: Payer: Self-pay | Admitting: Cardiology

## 2018-11-19 ENCOUNTER — Other Ambulatory Visit: Payer: Self-pay | Admitting: Cardiology

## 2018-11-19 MED ORDER — LOSARTAN POTASSIUM 50 MG PO TABS: 50.0000 mg | ORAL_TABLET | Freq: Every day | ORAL | 1 refills | Status: DC

## 2018-11-19 NOTE — Telephone Encounter (Signed)
Pt calling stating that his medication Losartan was denied and he wanted to know why. I called the pt and left a VM explaining to the pt that his medication was sent to the wrong pharmacy and I resent it to the correct [facility] mail order pharmacy as requested. Confirmation received.

## 2019-02-11 HISTORY — PX: SHOULDER SURGERY: SHX246

## 2019-03-16 ENCOUNTER — Other Ambulatory Visit: Payer: Self-pay | Admitting: Cardiology

## 2019-04-17 ENCOUNTER — Ambulatory Visit: Attending: Internal Medicine

## 2019-04-17 DIAGNOSIS — Z23 Encounter for immunization: Secondary | ICD-10-CM | POA: Insufficient documentation

## 2019-04-17 NOTE — Progress Notes (Signed)
   Covid-19 Vaccination Clinic  Name:  Gregory Hunter    MRN: [id number] DOB: 26-Aug-1960  04/17/2019  [name] was observed post Covid-19 immunization for 15 minutes without incident. He was provided with Vaccine Information Sheet and instruction to access the V-Safe system.   [name] was instructed to call 911 with any severe reactions post vaccine: Marland Kitchen Difficulty breathing  . Swelling of face and throat  . A fast heartbeat  . A bad rash all over body  . Dizziness and weakness   Immunizations Administered    Name Date Dose VIS Date Route   Pfizer COVID-19 Vaccine 04/17/2019  8:39 AM 0.3 mL 01/21/2019 Intramuscular   Manufacturer: [employer], [employer]   Lot: [id number]   NDC: [address]-2

## 2019-05-17 ENCOUNTER — Ambulatory Visit: Attending: Internal Medicine

## 2019-05-17 DIAGNOSIS — Z23 Encounter for immunization: Secondary | ICD-10-CM

## 2019-05-17 NOTE — Progress Notes (Signed)
   Covid-19 Vaccination Clinic  Name:  Gregory Hunter    MRN: [id number] DOB: 1960-09-06  05/17/2019  Mr. Rister was observed post Covid-19 immunization for 15 minutes without incident. He was provided with Vaccine Information Sheet and instruction to access the V-Safe system.   Mr. Chilson was instructed to call 911 with any severe reactions post vaccine: Marland Kitchen Difficulty breathing  . Swelling of face and throat  . A fast heartbeat  . A bad rash all over body  . Dizziness and weakness   Immunizations Administered    Name Date Dose VIS Date Route   Pfizer COVID-19 Vaccine 05/17/2019  9:37 AM 0.3 mL 01/21/2019 Intramuscular   Manufacturer: [employer], [employer]   Lot: [id number]   NDC: [address]-8

## 2019-06-13 ENCOUNTER — Other Ambulatory Visit: Payer: Self-pay | Admitting: Cardiology

## 2019-08-16 IMAGING — CT CT ANGIOGRAPHY CHEST
2 of 7 series · 18 of 46 positions shown · IV contrast (OMNIPAQUE 350)
Comparison: Calcium score CT on 07/16/2017

CLINICAL DATA: Dilatation of the ascending thoracic aorta by prior
coronary calcium score CT.

EXAM:
CT ANGIOGRAPHY CHEST WITH CONTRAST
TECHNIQUE: Multidetector CT imaging of the chest was performed using the
standard protocol during bolus administration of intravenous
contrast. Multiplanar CT image reconstructions and MIPs were
obtained to evaluate the vascular anatomy.
CONTRAST:  100mL OMNIPAQUE IOHEXOL 350 MG/ML SOLN

[Series 4: aorta 3.0 i31f 2 · axial · 0.80mm/px · z∈[-374,-74]mm · 15 of 109 slices shown]
[im 5/109  lung]
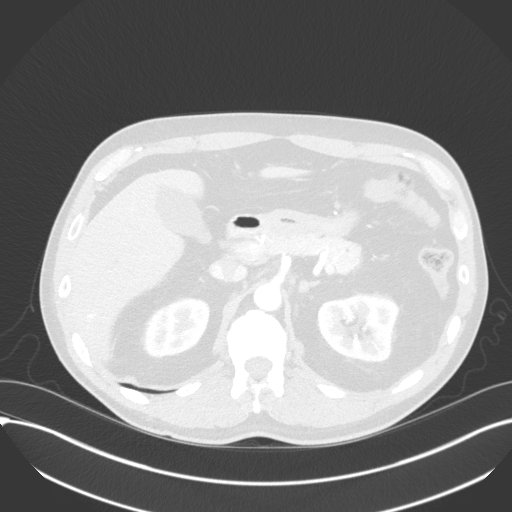
[im 13/109  soft-tissue]
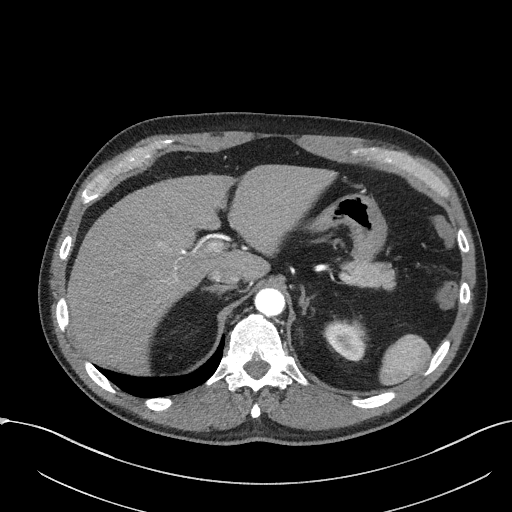
[im 21/109  lung]
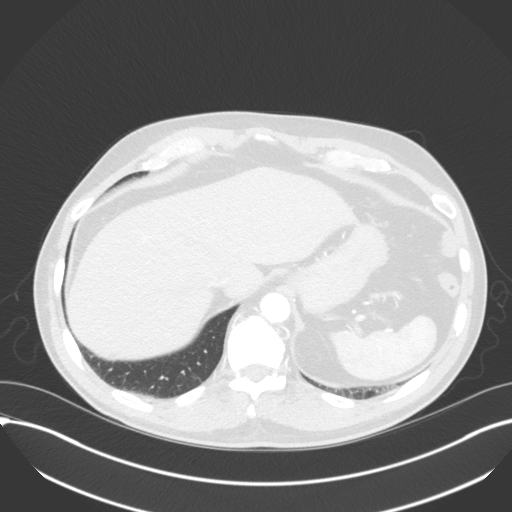
[im 29/109  soft-tissue]
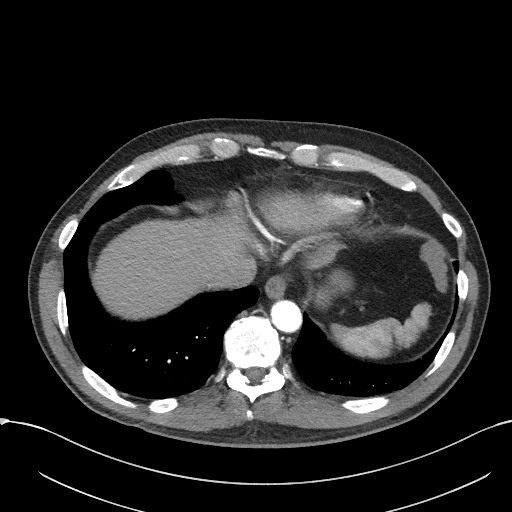
[im 33/109  lung]
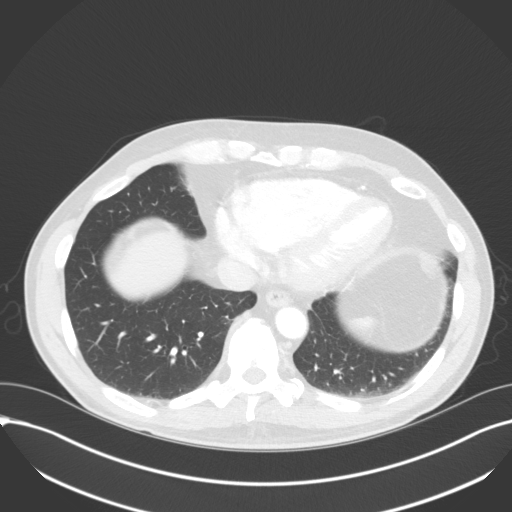
[im 41/109  soft-tissue]
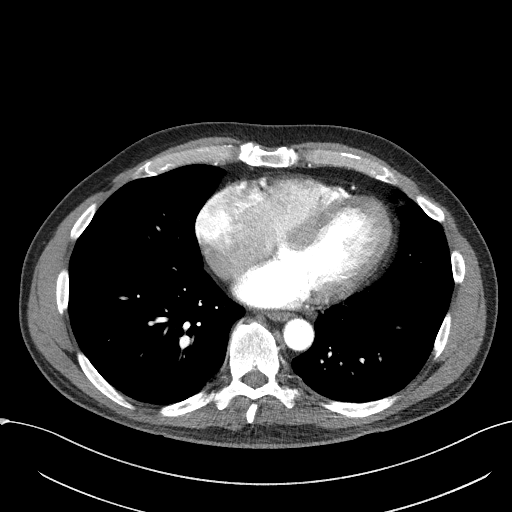
[im 49/109  lung]
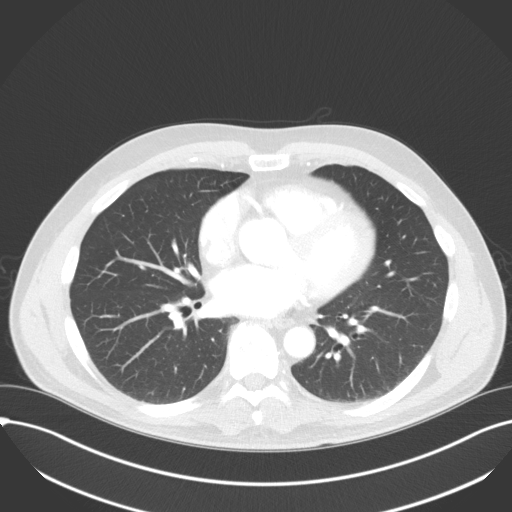
[im 57/109  soft-tissue]
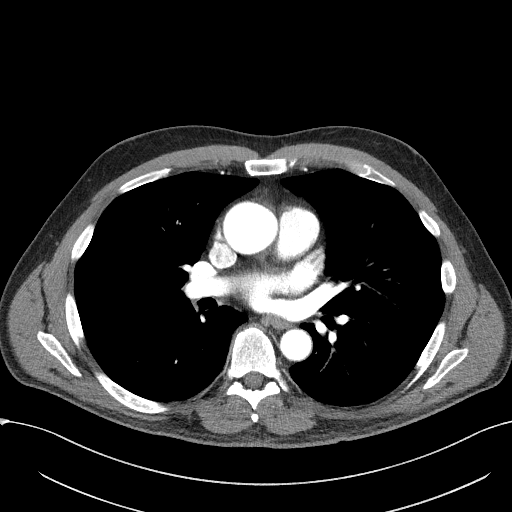
[im 61/109  lung]
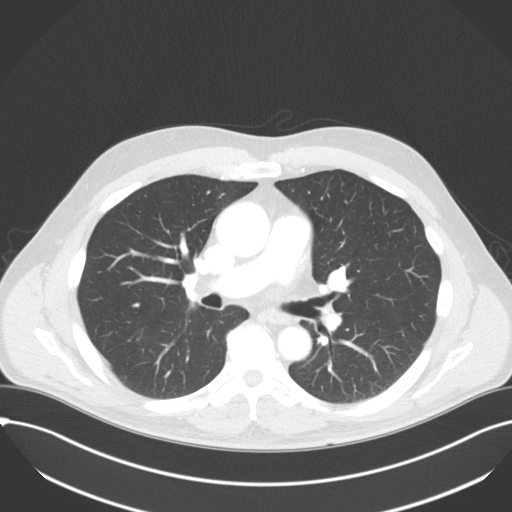
[im 69/109  soft-tissue]
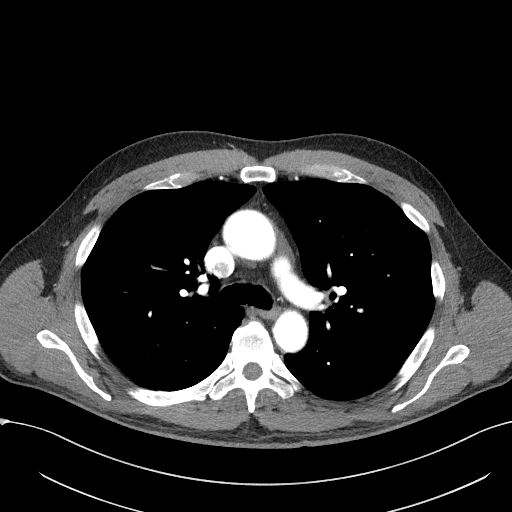
[im 77/109  lung]
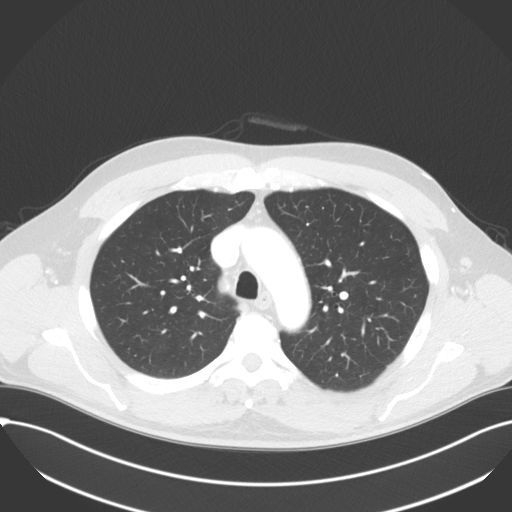
[im 81/109  soft-tissue]
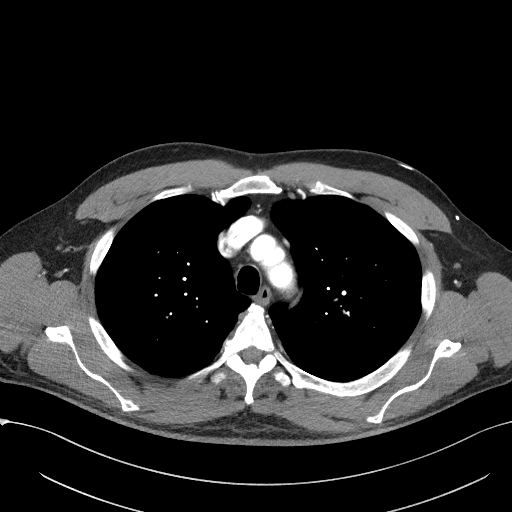
[im 89/109  lung]
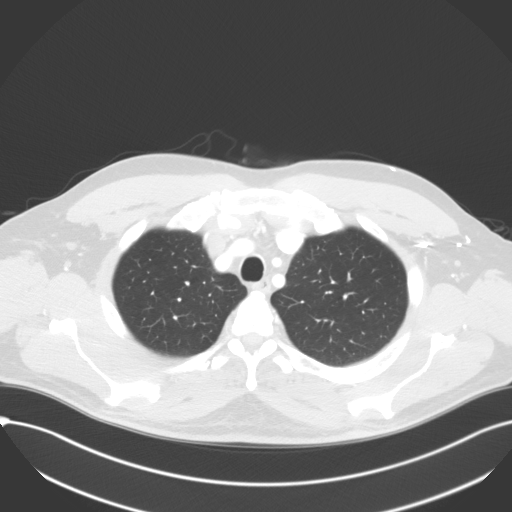
[im 97/109  soft-tissue]
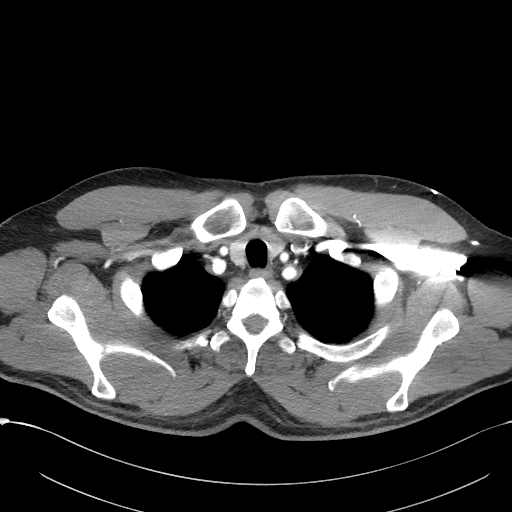
[im 105/109  lung]
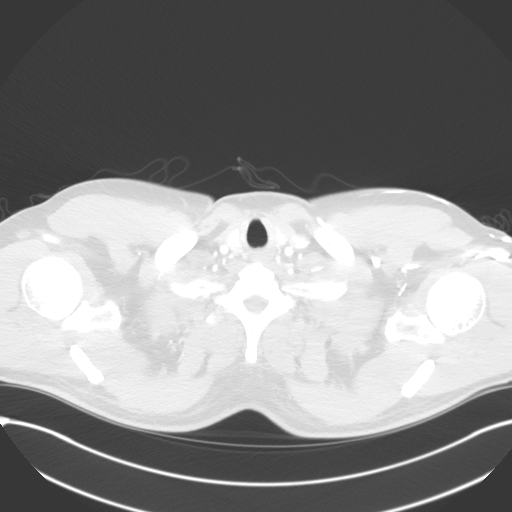

[Series 7: coronals · coronal · 0.66mm/px · 3 of 126 slices shown]
[im 32/126  soft-tissue]
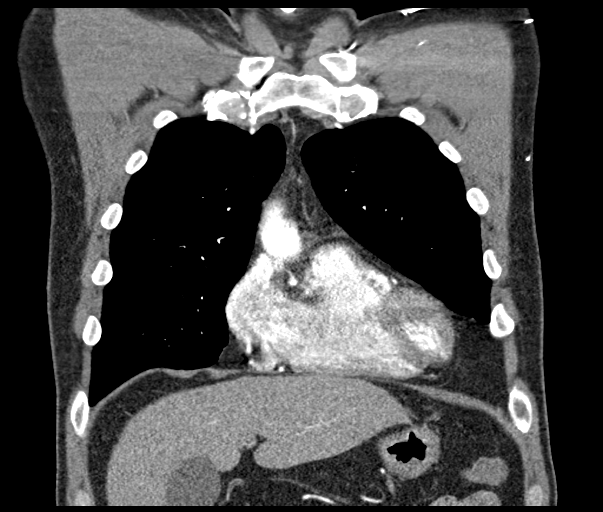
[im 63/126  soft-tissue]
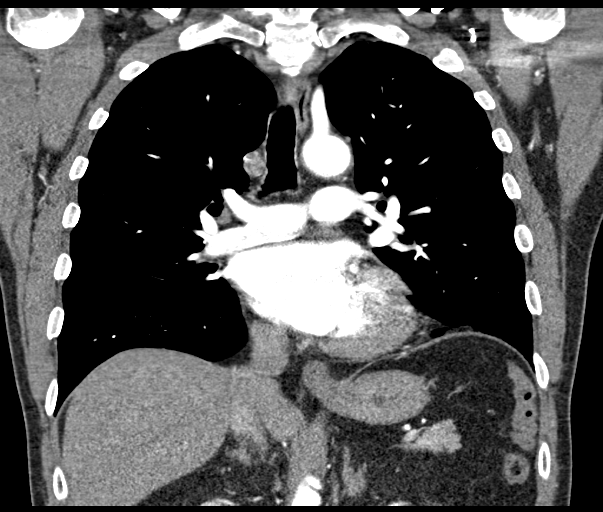
[im 94/126  soft-tissue]
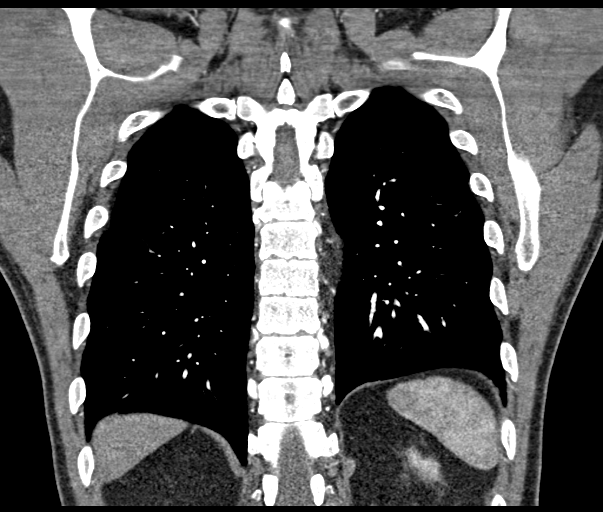

[18 of 46 positions shown; findings below may reference images not displayed]

FINDINGS: Cardiovascular: The aortic root measures approximately 3.8-3.9 cm at
the level of the sinuses of Valsalva. There is stable mild
dilatation of the ascending thoracic aorta measuring up to 4.2 cm.
The proximal arch measures 3.5 cm and the distal arch 2.9 cm. The
descending thoracic aorta measures 2.5 cm. No evidence of aortic
dissection or significant atherosclerotic plaque in the thoracic
aorta. Visualized proximal great vessels are normally patent and
demonstrate normal branching anatomy.

The heart size is normal. No pericardial fluid is identified.
Calcified plaque again noted in the distribution of the LAD. Central
pulmonary arteries are normal in caliber.

Mediastinum/Nodes: No enlarged mediastinal, hilar, or axillary lymph
nodes. Thyroid gland, trachea, and esophagus demonstrate no
significant findings.

Lungs/Pleura: Stable mild scarring in the inferior right middle lobe
and at the left lung base. There is no evidence of pulmonary edema,
consolidation, pneumothorax, nodule or pleural fluid.

Upper Abdomen: No acute abnormality.

Musculoskeletal: No chest wall abnormality. No acute or significant
osseous findings.

Review of the MIP images confirms the above findings.
IMPRESSION: Stable mild dilatation of the ascending thoracic aorta measuring up
to 4.2 cm. No evidence of dissection.

Recommend annual imaging followup by CTA or MRA. This recommendation
follows 6393 ACCF/AHA/AATS/ACR/ASA/SCA/SAGASTUME/COOL LINE/APPASAMY/TURRUBIARTES Guidelines
for the Diagnosis and Management of Patients with Thoracic Aortic
Disease. Circulation. 6393; 121: E266-e369. Aortic aneurysm NOS
(YATJG-MMI.N)

Aortic aneurysm NOS (YATJG-MMI.N).

## 2019-09-19 ENCOUNTER — Other Ambulatory Visit: Payer: Self-pay | Admitting: Cardiology

## 2019-09-22 MED ORDER — PRAVASTATIN SODIUM 80 MG PO TABS: ORAL_TABLET | ORAL | 0 refills | Status: DC

## 2019-09-22 MED ORDER — LOSARTAN POTASSIUM 50 MG PO TABS: ORAL_TABLET | ORAL | 0 refills | Status: DC

## 2020-03-12 ENCOUNTER — Other Ambulatory Visit: Payer: Self-pay | Admitting: Cardiology

## 2020-03-30 MED ORDER — LOSARTAN POTASSIUM 50 MG PO TABS: 50.0000 mg | ORAL_TABLET | Freq: Every day | ORAL | 0 refills | Status: DC

## 2020-03-30 MED ORDER — PRAVASTATIN SODIUM 80 MG PO TABS: 80.0000 mg | ORAL_TABLET | Freq: Every day | ORAL | 0 refills | Status: DC

## 2020-04-02 ENCOUNTER — Other Ambulatory Visit: Payer: Self-pay | Admitting: Cardiology

## 2020-04-08 DIAGNOSIS — I77810 Thoracic aortic ectasia: Secondary | ICD-10-CM | POA: Insufficient documentation

## 2020-04-08 NOTE — Progress Notes (Signed)
Cardiology Office Note   Date:  04/10/2020   ID:  Gregory Hunter, DOB Oct 17, 1960, MRN [id number]  PCP:  Jarome Matin, MD  Cardiologist:   Rollene Rotunda, MD  Chief Complaint  Patient presents with  . Elevated Coronary Calcium      History of Present Illness: Gregory Hunter is a 60 y.o. male who presents for follow up of an abnormal EKG. He has had this noted when he was in the [employer]. At some point along the way he did have a cardiac catheterization which he says was normal.  He did have a treadmill perfusion study in December 2008 demonstrated normal perfusion is a normal EF. Echocardiogram showed mild LVH that time. He had a negative POET (Plain Old Exercise Treadmill) in 2012. Echocardiogram has demonstrated some mild mitral regurgitation in 2012.   I sent him for a coronary calcium that was 140 and at the 82nd%.  He also has slight aortic enlargement.     I did a tele visit with him last year.  Since I saw him he has had shoulder surgery. He has not been exercising as much with this and with some C-spine problems that he is having. However, he does go to the [facility]. He rides an exercise bike. He does some other physical activities. The patient denies any new symptoms such as chest discomfort, neck or arm discomfort. There has been no new shortness of breath, PND or orthopnea. There have been no reported palpitations, presyncope or syncope.   As previously described he had very mild transient dizziness lasting few seconds sometimes when he is not exercising.    Past Medical History:  Diagnosis Date  . Abnormal EKG   . Elevated troponin   . Hypertension     Past Surgical History:  Procedure Laterality Date  . back fusion  2007     Current Outpatient Medications  Medication Sig Dispense Refill  . amLODipine (NORVASC) 10 MG tablet TAKE 1 TABLET DAILY 30 tablet 1  . Cholecalciferol (VITAMIN D3) 25 MCG (1000 UT) CAPS Take by mouth.    . ezetimibe (ZETIA) 10 MG tablet Take 1 tablet  (10 mg total) by mouth daily. 90 tablet 3  . losartan (COZAAR) 50 MG tablet Take 1 tablet (50 mg total) by mouth daily. TAKE AS INSTRUCTED BY YOUR PRESCRIBER 90 tablet 0  . pravastatin (PRAVACHOL) 80 MG tablet Take 1 tablet (80 mg total) by mouth daily. TAKE AS INSTRUCTED BY YOUR PRESCRIBER 90 tablet 0   No current facility-administered medications for this visit.    Allergies:   Patient has no known allergies.    ROS:  Please see the history of present illness.   Otherwise, review of systems are positive for none.   All other systems are reviewed and negative.    PHYSICAL EXAM: VS:  BP 120/90   Pulse 66   Ht 5\' 10"  (1.778 m)   Wt 205 lb 6.4 oz (93.2 kg)   SpO2 95%   BMI 29.47 kg/m  , BMI Body mass index is 29.47 kg/m. GENERAL:  Well appearing NECK:  No jugular venous distention, waveform within normal limits, carotid upstroke brisk and symmetric, no bruits, no thyromegaly LUNGS:  Clear to auscultation bilaterally CHEST:  Unremarkable HEART:  PMI not displaced or sustained,S1 and S2 within normal limits, no S3, no S4, no clicks, no rubs, no murmurs ABD:  Flat, positive bowel sounds normal in frequency in pitch, no bruits, no rebound, no guarding, no midline pulsatile  mass, no hepatomegaly, no splenomegaly EXT:  2 plus pulses throughout, no edema, no cyanosis no clubbing   EKG:  EKG is ordered today. The ekg ordered today demonstrates sinus rhythm, rate 66, first-degree AV block, inferolateral T wave inversions evident on previous EKGs.   Recent Labs: No results found for requested labs within last 8760 hours.    Lipid Panel    Component Value Date/Time   CHOL 143 07/02/2018 0809   TRIG 59 07/02/2018 0809   HDL 72 07/02/2018 0809   CHOLHDL 2.0 07/02/2018 0809   LDLCALC 59 07/02/2018 0809      Wt Readings from Last 3 Encounters:  04/10/20 205 lb 6.4 oz (93.2 kg)  05/24/18 205 lb (93 kg)  07/20/17 202 lb (91.6 kg)      Other studies Reviewed: Additional studies/  records that were reviewed today include: Labs. Review of the above records demonstrates:  Please see elsewhere in the note.     ASSESSMENT AND PLAN:  Elevated coronary calcium:   His MESA score is 10.5% with the coronary calcium added.   Given the evidence of coronary calcium previously I agree with aggressive risk reduction including the statin that he is on. He is going to be exercising. If he has any symptoms I would plan for stress testing as a screening. However, in the absence of that he will continue with primary risk reduction and no further testing.  Hypertension: BP is  at target. He should continue the meds as listed.  Hyperlipidemia:  LDL is 78. His HDL 69. No change in therapy.  Dilated aorta: This was 4. cm on the CT in 2020.  He needs follow up CT.    MR: He had mild mitral vegetation. I will follow this with an echo as it has been several years.    Current medicines are reviewed at length with the patient today.  The patient does not have concerns regarding medicines.  The following changes have been made:  no change  Labs/ tests ordered today include: None  Orders Placed This Encounter  Procedures  . CT ANGIO CHEST AORTA W/CM & OR WO/CM  . EKG 12-Lead  . ECHOCARDIOGRAM COMPLETE     Disposition:   FU with me in one year.      Signed, Rollene Rotunda, MD  04/10/2020 4:39 PM    [facility]

## 2020-04-10 ENCOUNTER — Encounter: Payer: Self-pay | Admitting: Cardiology

## 2020-04-10 ENCOUNTER — Other Ambulatory Visit: Payer: Self-pay

## 2020-04-10 ENCOUNTER — Ambulatory Visit: Admitting: Cardiology

## 2020-04-10 VITALS — BP 120/90 | HR 66 | Ht 70.0 in | Wt 205.4 lb

## 2020-04-10 DIAGNOSIS — I340 Nonrheumatic mitral (valve) insufficiency: Secondary | ICD-10-CM

## 2020-04-10 DIAGNOSIS — R931 Abnormal findings on diagnostic imaging of heart and coronary circulation: Secondary | ICD-10-CM

## 2020-04-10 DIAGNOSIS — I10 Essential (primary) hypertension: Secondary | ICD-10-CM | POA: Diagnosis not present

## 2020-04-10 DIAGNOSIS — I77810 Thoracic aortic ectasia: Secondary | ICD-10-CM

## 2020-04-10 DIAGNOSIS — E785 Hyperlipidemia, unspecified: Secondary | ICD-10-CM | POA: Diagnosis not present

## 2020-04-10 NOTE — Patient Instructions (Signed)
Medication Instructions:  No changes *If you need a refill on your cardiac medications before your next appointment, please call your pharmacy*  Testing/Procedures: CT Aorta in August  Your physician has requested that you have an echocardiogram. Echocardiography is a painless test that uses sound waves to create images of your heart. It provides your doctor with information about the size and shape of your heart and how well your heart's chambers and valves are working. This procedure takes approximately one hour. There are no restrictions for this procedure. [address]  Follow-Up: At [facility], you and your health needs are our priority.  As part of our continuing mission to provide you with exceptional heart care, we have created designated Provider Care Teams.  These Care Teams include your primary Cardiologist (physician) and Advanced Practice Providers (APPs -  Physician Assistants and Nurse Practitioners) who all work together to provide you with the care you need, when you need it.   Your next appointment:   12 month(s) You will receive a reminder letter in the mail two months in advance. If you don't receive a letter, please call our office to schedule the follow-up appointment.  The format for your next appointment:   In Person  Provider:   Rollene Rotunda, MD

## 2020-04-11 ENCOUNTER — Telehealth: Payer: Self-pay | Admitting: Cardiology

## 2020-04-11 NOTE — Telephone Encounter (Signed)
Left message for patient to call and discuss scheduling the CTA chest/aorta ordered by Dr. Antoine Poche

## 2020-04-12 ENCOUNTER — Other Ambulatory Visit: Payer: Self-pay

## 2020-04-12 DIAGNOSIS — Z01812 Encounter for preprocedural laboratory examination: Secondary | ICD-10-CM

## 2020-04-12 NOTE — Progress Notes (Signed)
BMP order placed for CT Angio Chest / Aorta.

## 2020-04-12 NOTE — Telephone Encounter (Signed)
Spoke with patient regarding 04/27/20 1:30 pm scheduled CTA chest/aorta appointment at [facility]--[address], [address]--arrival time is 1:15pm for check in--patient to come in Monday 04/23/20 for labs.  Patient voiced his understanding.WIll mail information to patient and iit is available in My Chart.

## 2020-04-24 LAB — BASIC METABOLIC PANEL
BUN/Creatinine Ratio: 13 (ref 9–20)
BUN: 15 mg/dL (ref 6–24)
CO2: 23 mmol/L (ref 20–29)
Calcium: 9.3 mg/dL (ref 8.7–10.2)
Chloride: 104 mmol/L (ref 96–106)
Creatinine, Ser: 1.17 mg/dL (ref 0.76–1.27)
Glucose: 122 mg/dL — ABNORMAL HIGH (ref 65–99)
Potassium: 4.4 mmol/L (ref 3.5–5.2)
Sodium: 143 mmol/L (ref 134–144)
eGFR: 72 mL/min/{1.73_m2} (ref >59)

## 2020-04-27 ENCOUNTER — Other Ambulatory Visit: Payer: Self-pay

## 2020-04-27 ENCOUNTER — Ambulatory Visit (INDEPENDENT_AMBULATORY_CARE_PROVIDER_SITE_OTHER)
Admission: RE | Admit: 2020-04-27 | Discharge: 2020-04-27 | Disposition: A | Discharge status: Home / Self Care | Source: Ambulatory Visit | Attending: Cardiology | Admitting: Cardiology

## 2020-04-27 DIAGNOSIS — I77810 Thoracic aortic ectasia: Secondary | ICD-10-CM

## 2020-04-27 MED ORDER — IOHEXOL 350 MG/ML SOLN
100.0000 mL | Freq: Once | INTRAVENOUS | Status: AC | PRN
  Administered 2020-04-27: 100 mL via INTRAVENOUS

## 2020-04-30 ENCOUNTER — Other Ambulatory Visit: Payer: Self-pay | Admitting: *Deleted

## 2020-04-30 DIAGNOSIS — I712 Thoracic aortic aneurysm, without rupture, unspecified: Secondary | ICD-10-CM

## 2020-04-30 DIAGNOSIS — I719 Aortic aneurysm of unspecified site, without rupture: Secondary | ICD-10-CM

## 2020-05-07 ENCOUNTER — Ambulatory Visit (HOSPITAL_COMMUNITY): Attending: Cardiovascular Disease

## 2020-05-07 ENCOUNTER — Other Ambulatory Visit: Payer: Self-pay

## 2020-05-07 DIAGNOSIS — I340 Nonrheumatic mitral (valve) insufficiency: Secondary | ICD-10-CM | POA: Insufficient documentation

## 2020-05-07 DIAGNOSIS — I517 Cardiomegaly: Secondary | ICD-10-CM

## 2020-05-07 DIAGNOSIS — I77810 Thoracic aortic ectasia: Secondary | ICD-10-CM

## 2020-05-07 HISTORY — DX: Cardiomegaly: I51.7

## 2020-05-07 HISTORY — DX: Nonrheumatic mitral (valve) insufficiency: I34.0

## 2020-05-07 HISTORY — DX: Thoracic aortic ectasia: I77.810

## 2020-05-07 LAB — ECHOCARDIOGRAM COMPLETE
Area-P 1/2: 3.31 cm2
S' Lateral: 2 cm

## 2020-05-21 MED ORDER — AMLODIPINE BESYLATE 10 MG PO TABS: 1.0000 | ORAL_TABLET | Freq: Every day | ORAL | 3 refills | Status: DC

## 2020-06-13 ENCOUNTER — Other Ambulatory Visit: Payer: Self-pay | Admitting: Cardiology

## 2020-10-09 ENCOUNTER — Encounter (INDEPENDENT_AMBULATORY_CARE_PROVIDER_SITE_OTHER): Payer: Self-pay

## 2020-12-04 ENCOUNTER — Telehealth: Payer: Self-pay | Admitting: Cardiology

## 2020-12-04 NOTE — Telephone Encounter (Signed)
Routed to MD - what type of stress test, diagnosis?

## 2020-12-04 NOTE — Telephone Encounter (Signed)
CT test is due 04/2021 Routed to MD to advise if stress test is needed

## 2020-12-04 NOTE — Telephone Encounter (Signed)
  Patient would like to know if he will need to have a stress test done along with the CT for next year. Please advise.

## 2020-12-04 NOTE — Telephone Encounter (Signed)
Routed to triage, awaiting MD response

## 2020-12-14 NOTE — Telephone Encounter (Signed)
Rollene Rotunda, MD  You 46 minutes ago (12:29 PM)   I called and spoke with the patient.  My plan was treadmill testing only if he was having any symptoms.  If he is not having any shortness of breath or chest pain with his exercise and he is exercising routinely then we can forego doing any treadmill testing.  Again I have to leave this message on his voicemail.

## 2021-02-19 ENCOUNTER — Other Ambulatory Visit: Payer: Self-pay | Admitting: Orthopedic Surgery

## 2021-02-20 ENCOUNTER — Telehealth: Payer: Self-pay

## 2021-02-20 NOTE — Telephone Encounter (Signed)
° °  Pre-operative Risk Assessment    Patient Name: Gregory Hunter  DOB: 1960/04/25 MRN: [id number]      Request for Surgical Clearance    Procedure:   Anterior Cervical Decompression Fusion, Cervical 5-6, Cervical 6-7 with instrumentation and autograft  Date of Surgery:  Clearance 02/28/21                                 Surgeon:  Dr. Estill Bamberg Surgeon's Group or Practice Name:  [facility]  Phone number:  [phone] Fax number:  [phone]   Type of Clearance Requested:   - Medical    Type of Anesthesia:  Not Indicated   Additional requests/questions:   Time sensitive please fax back to Attn of Tobey Bride [phone]  Signed, Dorris Fetch   02/20/2021, 2:13 PM

## 2021-02-21 ENCOUNTER — Telehealth: Payer: Self-pay | Admitting: Cardiology

## 2021-02-21 NOTE — Telephone Encounter (Signed)
° °  Primary Cardiologist: Rollene Rotunda, MD  Chart reviewed as part of pre-operative protocol coverage. Given past medical history and time since last visit, based on ACC/AHA guidelines, [place] Deleo would be at acceptable risk for the planned procedure without further cardiovascular testing.   I spoke with the patient on 02/21/21 and he denies chest pain, dyspnea, or other cardiac concerns. He is able to achieve > 4 METS activity on a consistent basis without difficulty or concern.  RCRI for MACE is Class I 0.4%.  Patient was advised that if he develops new symptoms prior to surgery to contact our office to arrange a follow-up appointment.  He verbalized understanding.  I will route this recommendation to the requesting party via Epic fax function and remove from pre-op pool.  Please call with questions.  Levi Aland, NP-C    02/21/2021, 12:34 PM [facility] [address], [address] Office [phone] Fax [phone]

## 2021-02-21 NOTE — Telephone Encounter (Signed)
Returned call to patient for preop clearance. See attached telephone encounter

## 2021-02-21 NOTE — Telephone Encounter (Signed)
Patient returning call to preop... please advise

## 2021-02-21 NOTE — Telephone Encounter (Signed)
Left message for patient to call back regarding preoperative evaluation for upcoming surgery

## 2021-02-25 NOTE — Progress Notes (Signed)
Surgical Instructions    Your procedure is scheduled on Thursday, January 19th.  Report to [facility] Main Entrance "A" at 10:30 A.M., then check in with the Admitting office.  Call this number if you have problems the morning of surgery:  [phone]   If you have any questions prior to your surgery date call [phone]: Open Monday-Friday 8am-4pm    Remember:  Do not eat after midnight the night before your surgery  You may drink clear liquids until 9:30 AM the morning of your surgery.   Clear liquids allowed are: Water, Non-Citrus Juices (without pulp), Carbonated Beverages, Clear Tea, Black Coffee ONLY (NO MILK, CREAM OR POWDERED CREAMER of any kind), and Gatorade  Please complete your PRE-SURGERY ENSURE that was provided to you by 9:30 AM the morning of surgery.  Please, if able, drink it in one setting. DO NOT SIP.     Take these medicines the morning of surgery with A SIP OF WATER Amlodipine (Norvasc) Ezetimibe (Zetia) Pravastatin (Pravachol)   As of today, STOP taking any Aspirin (unless otherwise instructed by your surgeon) Aleve, Naproxen, Ibuprofen, Motrin, Advil, Goody's, BC's, all herbal medications, fish oil, and all vitamins.   DAY OF SURGERY:         Do not wear jewelry  Do not wear lotions, powders, colognes, or deodorant. Men may shave face and neck. Do not bring valuables to the hospital.             [facility] is not responsible for any belongings or valuables.  Do NOT Smoke (Tobacco/Vaping)  24 hours prior to your procedure  If you use a CPAP at night, you may bring your mask for your overnight stay.   Contacts, glasses, hearing aids, dentures or partials may not be worn into surgery, please bring cases for these belongings   For patients admitted to the hospital, discharge time will be determined by your treatment team.   Patients discharged the day of surgery will not be allowed to drive home, and someone needs to stay with them for 24  hours.  NO VISITORS WILL BE ALLOWED IN PRE-OP WHERE PATIENTS ARE PREPPED FOR SURGERY.  ONLY 1 SUPPORT PERSON MAY BE PRESENT IN THE WAITING ROOM WHILE YOU ARE IN SURGERY.  IF YOU ARE TO BE ADMITTED, ONCE YOU ARE IN YOUR ROOM YOU WILL BE ALLOWED TWO (2) VISITORS. 1 (ONE) VISITOR MAY STAY OVERNIGHT BUT MUST ARRIVE TO THE ROOM BY 8pm.  Minor children may have two parents present. Special consideration for safety and communication needs will be reviewed on a case by case basis.  Special instructions:    Oral Hygiene is also important to reduce your risk of infection.  Remember - BRUSH YOUR TEETH THE MORNING OF SURGERY WITH YOUR REGULAR TOOTHPASTE   [facility]- Preparing For Surgery  Before surgery, you can play an important role. Because skin is not sterile, your skin needs to be as free of germs as possible. You can reduce the number of germs on your skin by washing with CHG (chlorahexidine gluconate) Soap before surgery.  CHG is an antiseptic cleaner which kills germs and bonds with the skin to continue killing germs even after washing.     Please do not use if you have an allergy to CHG or antibacterial soaps. If your skin becomes reddened/irritated stop using the CHG.  Do not shave (including legs and underarms) for at least 48 hours prior to first CHG shower. It is OK to shave your face.  Please follow these instructions carefully.     Shower the NIGHT BEFORE SURGERY and the MORNING OF SURGERY with CHG Soap.   If you chose to wash your hair, wash your hair first as usual with your normal shampoo. After you shampoo, rinse your hair and body thoroughly to remove the shampoo.  Then [employer] and genitals (private parts) with your normal soap and rinse thoroughly to remove soap.  After that Use CHG Soap as you would any other liquid soap. You can apply CHG directly to the skin and wash gently with a scrungie or a clean washcloth.   Apply the CHG Soap to your body ONLY FROM THE NECK DOWN.  Do  not use on open wounds or open sores. Avoid contact with your eyes, ears, mouth and genitals (private parts). Wash Face and genitals (private parts)  with your normal soap.   Wash thoroughly, paying special attention to the area where your surgery will be performed.  Thoroughly rinse your body with warm water from the neck down.  DO NOT shower/wash with your normal soap after using and rinsing off the CHG Soap.  Pat yourself dry with a CLEAN TOWEL.  Wear CLEAN PAJAMAS to bed the night before surgery  Place CLEAN SHEETS on your bed the night before your surgery  DO NOT SLEEP WITH PETS.   Day of Surgery:  Take a shower with CHG soap. Wear Clean/Comfortable clothing the morning of surgery Do not apply any deodorants/lotions.   Remember to brush your teeth WITH YOUR REGULAR TOOTHPASTE.   Please read over the following fact sheets that you were given.

## 2021-02-26 ENCOUNTER — Encounter (HOSPITAL_COMMUNITY)
Admission: RE | Admit: 2021-02-26 | Discharge: 2021-02-26 | Disposition: A | Discharge status: Home / Self Care | Source: Ambulatory Visit | Attending: Orthopedic Surgery | Admitting: Orthopedic Surgery

## 2021-02-26 ENCOUNTER — Other Ambulatory Visit: Payer: Self-pay

## 2021-02-26 ENCOUNTER — Encounter (HOSPITAL_COMMUNITY): Payer: Self-pay

## 2021-02-26 VITALS — BP 134/96 | HR 61 | Temp 97.8°F | Resp 17 | Ht 70.0 in | Wt 193.6 lb

## 2021-02-26 DIAGNOSIS — Z01818 Encounter for other preprocedural examination: Secondary | ICD-10-CM

## 2021-02-26 DIAGNOSIS — Z01812 Encounter for preprocedural laboratory examination: Secondary | ICD-10-CM | POA: Insufficient documentation

## 2021-02-26 HISTORY — DX: Other complications of anesthesia, initial encounter: T88.59XA

## 2021-02-26 LAB — BASIC METABOLIC PANEL
Anion gap: 9 (ref 5–15)
BUN: 12 mg/dL (ref 6–20)
CO2: 29 mmol/L (ref 22–32)
Calcium: 9.7 mg/dL (ref 8.9–10.3)
Chloride: 102 mmol/L (ref 98–111)
Creatinine, Ser: 1.01 mg/dL (ref 0.61–1.24)
GFR, Estimated: >60 mL/min (ref >60)
Glucose, Bld: 102 mg/dL — ABNORMAL HIGH (ref 70–99)
Potassium: 4.4 mmol/L (ref 3.5–5.1)
Sodium: 140 mmol/L (ref 135–145)

## 2021-02-26 LAB — CBC
HCT: 44.7 % (ref 39.0–52.0)
Hemoglobin: 15.6 g/dL (ref 13.0–17.0)
MCH: 31.2 pg (ref 26.0–34.0)
MCHC: 34.9 g/dL (ref 30.0–36.0)
MCV: 89.4 fL (ref 80.0–100.0)
Platelets: 289 10*3/uL (ref 150–400)
RBC: 5 MIL/uL (ref 4.22–5.81)
RDW: 13.1 % (ref 11.5–15.5)
WBC: 5.3 10*3/uL (ref 4.0–10.5)
nRBC: 0 % (ref 0.0–0.2)

## 2021-02-26 LAB — SURGICAL PCR SCREEN
MRSA, PCR: NEGATIVE
Staphylococcus aureus: NEGATIVE

## 2021-02-26 NOTE — Progress Notes (Signed)
PCP - Jarome Matin Cardiologist - Dr. Antoine Poche Received cardiac clearance on 02/20/21  Chest x-ray - n/a EKG - 04/10/20 ECHO - 05/07/20   ERAS Protcol - yes, Ensure ordered & given   COVID TEST- n/a (ambulatory)   Anesthesia review: yes Patient stated that he vaso-vagals down after procedures.  From what he can recall, he has done this (3) different times after procedures.  James notified.    Cardiac clearance received 02/20/21  Patient denies shortness of breath, fever, cough and chest pain at PAT appointment   All instructions explained to the patient, with a verbal understanding of the material. Patient agrees to go over the instructions while at home for a better understanding. Patient also instructed to self quarantine after being tested for COVID-19. The opportunity to ask questions was provided.

## 2021-02-26 NOTE — Progress Notes (Addendum)
Anesthesia Chart Review:  Patient reports history of multiple episodes of vasovagal response following surgery.  He states that he has had 3 episodes in PACU following surgeries that involve bradycardia, diaphoresis, nausea vomiting.  He reports all episodes occurred when laying down.  He reports that were self-limited and lasted maybe 10 minutes. He denied LOC. He does not recall any specific interventions that were taken.  He reports good functional status at baseline, can ride exercise bike and go up multiple flights of stairs without any cardiopulmonary limitations.  He does report longstanding history of T wave abnormalities noted on EKG.  He has had a thorough cardiology evaluation with Dr. Percival Spanish.  Exercise tolerance test in 2019 was low risk, nonischemic.  Echocardiogram 2022 showed EF 70-75%, mild MR, mild dilation of the aortic root 42 mm.  Last seen by Dr. Percival Spanish 04/10/2020, recommended continue with primary risk reduction.  Preop labs reviewed, unremarkable.   EKG 04/10/20: Sinus rhythm with first-degree AV block.  Rate 66.  T wave abnormality, consider lateral ischemia.  TTE 05/07/2020:  1. Global longitudinal strain is -224%. Left ventricular ejection  fraction, by estimation, is 70 to 75%. The left ventricle has hyperdynamic  function. The left ventricle has no regional wall motion abnormalities.  There is mild left ventricular  hypertrophy. Left ventricular diastolic parameters are indeterminate.   2. Right ventricular systolic function is normal. The right ventricular  size is normal.   3. The mitral valve is normal in structure. Mild mitral valve  regurgitation.   4. The aortic valve is normal in structure. Aortic valve regurgitation is  not visualized.   5. Aortic dilatation noted. There is mild dilatation of the ascending  aorta, measuring 42 mm.   6. The inferior vena cava is dilated in size with >50% respiratory  variability, suggesting right atrial pressure of 8 mmHg.    CTA chest aorta 04/27/2020: IMPRESSION: 1. Stable 4 cm ascending thoracic aortic aneurysm. Recommend annual imaging followup by CTA or MRA. This recommendation follows 2010 ACCF/AHA/AATS/ACR/ASA/SCA/SCAI/SIR/STS/SVM Guidelines for the Diagnosis and Management of Patients with Thoracic Aortic Disease. Circulation. 2010; 121ML:4928372. Aortic aneurysm NOS (ICD10-I71.9) 2. Coronary calcifications. The severity of coronary artery disease and any potential stenosis cannot be assessed on this non-gated CT examination.  Exercise tolerance test 07/29/2017: Blood pressure demonstrated a normal response to exercise. No T wave inversion was noted during stress. There was no ST segment deviation noted during stress. Arrhythmias during stress: rare PVCs. Overall, the patient's exercise capacity was excellent Duke Treadmill Score: low risk   Negative stress test without evidence of ischemia at given workload.  CT cardiac scoring 07/16/2017: IMPRESSION: Coronary calcium score of 140 . This was 39 nd percentile for age and sex matched control.   Wynonia Musty [facility] Short Stay Center/Anesthesiology Phone [phone] 02/26/2021 4:27 PM

## 2021-02-26 NOTE — Anesthesia Preprocedure Evaluation (Addendum)
Anesthesia Evaluation  Patient identified by MRN, date of birth, ID band Patient awake    Reviewed: Allergy & Precautions, NPO status , Patient's Chart, lab work & pertinent test results  History of Anesthesia Complications (+) history of anesthetic complications (Frequent vasovagal episodes)  Airway Mallampati: I  TM Distance: >3 FB Neck ROM: Full    Dental  (+) Teeth Intact, Dental Advisory Given   Pulmonary neg pulmonary ROS, former smoker,    Pulmonary exam normal        Cardiovascular Exercise Tolerance: Good hypertension, Pt. on medications Normal cardiovascular exam  4 cm TAAA  Exercise tolerance test in 2019 was low risk, nonischemic.  Echocardiogram 2022 showed EF 70-75%, mild MR, mild dilation of the aortic root 42 mm.  Last seen by Dr. Antoine Poche 04/10/2020, recommended continue with primary risk reduction.   Neuro/Psych    GI/Hepatic negative GI ROS, Neg liver ROS,   Endo/Other  negative endocrine ROS  Renal/GU negative Renal ROS  negative genitourinary   Musculoskeletal negative musculoskeletal ROS (+)   Abdominal   Peds  Hematology negative hematology ROS (+)   Anesthesia Other Findings   Reproductive/Obstetrics                           Anesthesia Physical Anesthesia Plan  ASA: 2  Anesthesia Plan: General   Post-op Pain Management: Toradol IV (intra-op) and Ofirmev IV (intra-op)   Induction: Intravenous  PONV Risk Score and Plan: 2 and Ondansetron, Dexamethasone, Treatment may vary due to age or medical condition and Midazolam  Airway Management Planned: Oral ETT  Additional Equipment: None  Intra-op Plan:   Post-operative Plan: Extubation in OR  Informed Consent: I have reviewed the patients History and Physical, chart, labs and discussed the procedure including the risks, benefits and alternatives for the proposed anesthesia with the patient or authorized  representative who has indicated his/her understanding and acceptance.     Dental advisory given  Plan Discussed with:   Anesthesia Plan Comments: (PAT note by Antionette Poles, PA-C: Patient reports history of multiple episodes of vasovagal response following surgery.  He states that he has had 3 episodes in PACU following surgeries that involve bradycardia, diaphoresis, nausea vomiting.  He reports all episodes occurred when laying down.  He reports that were self-limited and lasted maybe 10 minutes. He denied LOC. He does not recall any specific interventions that were taken.  He reports good functional status at baseline, can ride exercise bike and go up multiple flights of stairs without any cardiopulmonary limitations.  He does report longstanding history of T wave abnormalities noted on EKG.  He has had a thorough cardiology evaluation with Dr. Antoine Poche.  Exercise tolerance test in 2019 was low risk, nonischemic.  Echocardiogram 2022 showed EF 70-75%, mild MR, mild dilation of the aortic root 42 mm.  Last seen by Dr. Antoine Poche 04/10/2020, recommended continue with primary risk reduction.  Preop labs reviewed, unremarkable.   EKG 04/10/20: Sinus rhythm with first-degree AV block.  Rate 66.  T wave abnormality, consider lateral ischemia.  TTE 05/07/2020: 1. Global longitudinal strain is -224%. Left ventricular ejection  fraction, by estimation, is 70 to 75%. The left ventricle has hyperdynamic  function. The left ventricle has no regional wall motion abnormalities.  There is mild left ventricular  hypertrophy. Left ventricular diastolic parameters are indeterminate.  2. Right ventricular systolic function is normal. The right ventricular  size is normal.  3. The mitral valve is  normal in structure. Mild mitral valve  regurgitation.  4. The aortic valve is normal in structure. Aortic valve regurgitation is  not visualized.  5. Aortic dilatation noted. There is mild dilatation of the  ascending  aorta, measuring 42 mm.  6. The inferior vena cava is dilated in size with >50% respiratory  variability, suggesting right atrial pressure of 8 mmHg.   CTA chest aorta 04/27/2020: IMPRESSION: 1. Stable 4 cm ascending thoracic aortic aneurysm. Recommend annual imaging followup by CTA or MRA. This recommendation follows 2010 ACCF/AHA/AATS/ACR/ASA/SCA/SCAI/SIR/STS/SVM Guidelines for the Diagnosis and Management of Patients with Thoracic Aortic Disease. Circulation. 2010; 121: O270-J500. Aortic aneurysm NOS (ICD10-I71.9) 2. Coronary calcifications. The severity of coronary artery disease and any potential stenosis cannot be assessed on this non-gated CT examination.  Exercise tolerance test 07/29/2017:  Blood pressure demonstrated a normal response to exercise.  No T wave inversion was noted during stress.  There was no ST segment deviation noted during stress.  Arrhythmias during stress: rare PVCs.  Overall, the patient's exercise capacity was excellent  Duke Treadmill Score: low risk  Negative stress test without evidence of ischemia at given workload.  CT cardiac scoring 07/16/2017: IMPRESSION: Coronary calcium score of 140 . This was 65 nd percentile for age and sex matched control.   )      Anesthesia Quick Evaluation

## 2021-02-28 ENCOUNTER — Encounter (HOSPITAL_COMMUNITY): Payer: Self-pay | Admitting: Orthopedic Surgery

## 2021-02-28 ENCOUNTER — Ambulatory Visit (HOSPITAL_COMMUNITY): Admitting: Certified Registered"

## 2021-02-28 ENCOUNTER — Ambulatory Visit (HOSPITAL_COMMUNITY)
Admission: RE | Admit: 2021-02-28 | Discharge: 2021-02-28 | Disposition: A | Discharge status: Home / Self Care | Attending: Orthopedic Surgery | Admitting: Orthopedic Surgery

## 2021-02-28 ENCOUNTER — Encounter (HOSPITAL_COMMUNITY)
Admission: RE | Disposition: A | Discharge status: Home / Self Care | Payer: Self-pay | Source: Home / Self Care | Attending: Orthopedic Surgery

## 2021-02-28 ENCOUNTER — Other Ambulatory Visit: Payer: Self-pay

## 2021-02-28 ENCOUNTER — Ambulatory Visit (HOSPITAL_COMMUNITY): Admitting: Physician Assistant

## 2021-02-28 ENCOUNTER — Ambulatory Visit (HOSPITAL_COMMUNITY)

## 2021-02-28 DIAGNOSIS — Z87891 Personal history of nicotine dependence: Secondary | ICD-10-CM | POA: Insufficient documentation

## 2021-02-28 DIAGNOSIS — I10 Essential (primary) hypertension: Secondary | ICD-10-CM | POA: Diagnosis not present

## 2021-02-28 DIAGNOSIS — Z419 Encounter for procedure for purposes other than remedying health state, unspecified: Secondary | ICD-10-CM

## 2021-02-28 DIAGNOSIS — M4802 Spinal stenosis, cervical region: Secondary | ICD-10-CM | POA: Diagnosis not present

## 2021-02-28 DIAGNOSIS — M5412 Radiculopathy, cervical region: Secondary | ICD-10-CM | POA: Diagnosis not present

## 2021-02-28 HISTORY — PX: ANTERIOR CERVICAL DECOMP/DISCECTOMY FUSION: SHX1161

## 2021-02-28 SURGERY — ANTERIOR CERVICAL DECOMPRESSION/DISCECTOMY FUSION 2 LEVELS
Anesthesia: General | Site: Spine Cervical

## 2021-02-28 MED ORDER — ONDANSETRON HCL 4 MG/2ML IJ SOLN: 4.0000 mg | Freq: Once | INTRAMUSCULAR | Status: DC | PRN

## 2021-02-28 MED ORDER — LIDOCAINE 2% (20 MG/ML) 5 ML SYRINGE
INTRAMUSCULAR | Status: DC | PRN
Start: 2021-02-28 — End: 2021-02-28
  Administered 2021-02-28: 100 mg via INTRAVENOUS

## 2021-02-28 MED ORDER — PHENYLEPHRINE HCL-NACL 20-0.9 MG/250ML-% IV SOLN
INTRAVENOUS | Status: DC | PRN
  Administered 2021-02-28: 50 ug/min via INTRAVENOUS

## 2021-02-28 MED ORDER — FENTANYL CITRATE (PF) 250 MCG/5ML IJ SOLN
INTRAMUSCULAR | Status: AC
  Filled 2021-02-28: qty 5

## 2021-02-28 MED ORDER — BUPIVACAINE-EPINEPHRINE 0.25% -1:200000 IJ SOLN
INTRAMUSCULAR | Status: DC | PRN
  Administered 2021-02-28: 6 mL

## 2021-02-28 MED ORDER — AMISULPRIDE (ANTIEMETIC) 5 MG/2ML IV SOLN
INTRAVENOUS | Status: AC
  Filled 2021-02-28: qty 4

## 2021-02-28 MED ORDER — CEFAZOLIN SODIUM-DEXTROSE 2-4 GM/100ML-% IV SOLN
2.0000 g | INTRAVENOUS | Status: AC
  Administered 2021-02-28: 2 g via INTRAVENOUS
  Filled 2021-02-28: qty 100

## 2021-02-28 MED ORDER — 0.9 % SODIUM CHLORIDE (POUR BTL) OPTIME
TOPICAL | Status: DC | PRN
  Administered 2021-02-28: 1000 mL

## 2021-02-28 MED ORDER — ROCURONIUM BROMIDE 10 MG/ML (PF) SYRINGE
PREFILLED_SYRINGE | INTRAVENOUS | Status: DC | PRN
  Administered 2021-02-28: 30 mg via INTRAVENOUS
  Administered 2021-02-28: 70 mg via INTRAVENOUS

## 2021-02-28 MED ORDER — ROCURONIUM BROMIDE 10 MG/ML (PF) SYRINGE
PREFILLED_SYRINGE | INTRAVENOUS | Status: AC
  Filled 2021-02-28: qty 10

## 2021-02-28 MED ORDER — DEXAMETHASONE SODIUM PHOSPHATE 10 MG/ML IJ SOLN
INTRAMUSCULAR | Status: DC | PRN
  Administered 2021-02-28: 10 mg via INTRAVENOUS

## 2021-02-28 MED ORDER — BUPIVACAINE-EPINEPHRINE (PF) 0.25% -1:200000 IJ SOLN
INTRAMUSCULAR | Status: AC
  Filled 2021-02-28: qty 30

## 2021-02-28 MED ORDER — SUGAMMADEX SODIUM 200 MG/2ML IV SOLN
INTRAVENOUS | Status: DC | PRN
  Administered 2021-02-28: 200 mg via INTRAVENOUS

## 2021-02-28 MED ORDER — HYDROMORPHONE HCL 1 MG/ML IJ SOLN
0.2500 mg | INTRAMUSCULAR | Status: DC | PRN
  Administered 2021-02-28 (×3): 0.5 mg via INTRAVENOUS

## 2021-02-28 MED ORDER — HYDROCODONE-ACETAMINOPHEN 5-325 MG PO TABS: 1.0000 | ORAL_TABLET | Freq: Four times a day (QID) | ORAL | 0 refills | Status: DC | PRN

## 2021-02-28 MED ORDER — FENTANYL CITRATE (PF) 100 MCG/2ML IJ SOLN
INTRAMUSCULAR | Status: DC | PRN
  Administered 2021-02-28: 100 ug via INTRAVENOUS
  Administered 2021-02-28 (×3): 50 ug via INTRAVENOUS

## 2021-02-28 MED ORDER — PROPOFOL 10 MG/ML IV BOLUS
INTRAVENOUS | Status: AC
  Filled 2021-02-28: qty 20

## 2021-02-28 MED ORDER — EPHEDRINE SULFATE-NACL 50-0.9 MG/10ML-% IV SOSY
PREFILLED_SYRINGE | INTRAVENOUS | Status: DC | PRN
  Administered 2021-02-28: 5 mg via INTRAVENOUS

## 2021-02-28 MED ORDER — GLYCOPYRROLATE PF 0.2 MG/ML IJ SOSY
PREFILLED_SYRINGE | INTRAMUSCULAR | Status: DC | PRN
  Administered 2021-02-28: .2 mg via INTRAVENOUS

## 2021-02-28 MED ORDER — THROMBIN 20000 UNITS EX SOLR
CUTANEOUS | Status: DC | PRN
  Administered 2021-02-28: 20000 [IU] via TOPICAL

## 2021-02-28 MED ORDER — THROMBIN 20000 UNITS EX SOLR
CUTANEOUS | Status: AC
  Filled 2021-02-28: qty 20000

## 2021-02-28 MED ORDER — LIDOCAINE 2% (20 MG/ML) 5 ML SYRINGE
INTRAMUSCULAR | Status: AC
  Filled 2021-02-28: qty 5

## 2021-02-28 MED ORDER — POVIDONE-IODINE 7.5 % EX SOLN
Freq: Once | CUTANEOUS | Status: DC
  Filled 2021-02-28: qty 118

## 2021-02-28 MED ORDER — ORAL CARE MOUTH RINSE: 15.0000 mL | Freq: Once | OROMUCOSAL | Status: AC

## 2021-02-28 MED ORDER — HYDROMORPHONE HCL 1 MG/ML IJ SOLN
INTRAMUSCULAR | Status: AC
  Filled 2021-02-28: qty 1

## 2021-02-28 MED ORDER — ACETAMINOPHEN 500 MG PO TABS
1000.0000 mg | ORAL_TABLET | Freq: Once | ORAL | Status: AC
  Administered 2021-02-28: 1000 mg via ORAL
  Filled 2021-02-28: qty 2

## 2021-02-28 MED ORDER — OXYCODONE HCL 5 MG PO TABS
5.0000 mg | ORAL_TABLET | Freq: Once | ORAL | Status: AC | PRN
  Administered 2021-02-28: 5 mg via ORAL

## 2021-02-28 MED ORDER — AMISULPRIDE (ANTIEMETIC) 5 MG/2ML IV SOLN
10.0000 mg | Freq: Once | INTRAVENOUS | Status: AC | PRN
  Administered 2021-02-28: 10 mg via INTRAVENOUS

## 2021-02-28 MED ORDER — MIDAZOLAM HCL 5 MG/5ML IJ SOLN
INTRAMUSCULAR | Status: DC | PRN
Start: 2021-02-28 — End: 2021-02-28
  Administered 2021-02-28: 2 mg via INTRAVENOUS

## 2021-02-28 MED ORDER — ONDANSETRON HCL 4 MG/2ML IJ SOLN
INTRAMUSCULAR | Status: DC | PRN
  Administered 2021-02-28: 4 mg via INTRAVENOUS

## 2021-02-28 MED ORDER — LACTATED RINGERS IV SOLN: INTRAVENOUS | Status: DC

## 2021-02-28 MED ORDER — OXYCODONE HCL 5 MG PO TABS
ORAL_TABLET | ORAL | Status: AC
  Filled 2021-02-28: qty 1

## 2021-02-28 MED ORDER — OXYCODONE HCL 5 MG/5ML PO SOLN: 5.0000 mg | Freq: Once | ORAL | Status: AC | PRN

## 2021-02-28 MED ORDER — REMIFENTANIL HCL 1 MG IV SOLR
0.1500 ug/kg/min | INTRAVENOUS | Status: AC
  Administered 2021-02-28: .15 ug/kg/min via INTRAVENOUS
  Filled 2021-02-28: qty 2000

## 2021-02-28 MED ORDER — METHOCARBAMOL 500 MG PO TABS: 500.0000 mg | ORAL_TABLET | Freq: Four times a day (QID) | ORAL | 0 refills | Status: DC | PRN

## 2021-02-28 MED ORDER — CHLORHEXIDINE GLUCONATE 0.12 % MT SOLN
15.0000 mL | Freq: Once | OROMUCOSAL | Status: AC
  Administered 2021-02-28: 15 mL via OROMUCOSAL
  Filled 2021-02-28: qty 15

## 2021-02-28 MED ORDER — MIDAZOLAM HCL 2 MG/2ML IJ SOLN
INTRAMUSCULAR | Status: AC
  Filled 2021-02-28: qty 2

## 2021-02-28 MED ORDER — PROPOFOL 10 MG/ML IV BOLUS
INTRAVENOUS | Status: DC | PRN
  Administered 2021-02-28: 200 mg via INTRAVENOUS

## 2021-02-28 SURGICAL SUPPLY — 78 items
BAG COUNTER SPONGE SURGICOUNT (BAG) ×2 IMPLANT
BENZOIN TINCTURE PRP APPL 2/3 (GAUZE/BANDAGES/DRESSINGS) ×2 IMPLANT
BIT DRILL NEURO 2X3.1 SFT TUCH (MISCELLANEOUS) ×1 IMPLANT
BIT DRILL SRG 14X2.2XFLT CHK (BIT) IMPLANT
BIT DRL SRG 14X2.2XFLT CHK (BIT) ×1
BLADE CLIPPER SURG (BLADE) ×2 IMPLANT
BLADE SURG 15 STRL LF DISP TIS (BLADE) ×1 IMPLANT
BLADE SURG 15 STRL SS (BLADE) ×1
BONE VIVIGEN FORMABLE 1.3CC (Bone Implant) ×2 IMPLANT
CARTRIDGE OIL MAESTRO DRILL (MISCELLANEOUS) ×1 IMPLANT
COLLAR CERV LO CONTOUR FIRM DE (SOFTGOODS) IMPLANT
CORD BIPOLAR FORCEPS 12FT (ELECTRODE) ×2 IMPLANT
COVER SURGICAL LIGHT HANDLE (MISCELLANEOUS) ×2 IMPLANT
DIFFUSER DRILL AIR PNEUMATIC (MISCELLANEOUS) ×2 IMPLANT
DRAIN JACKSON RD 7FR 3/32 (WOUND CARE) IMPLANT
DRAPE C-ARM 42X72 X-RAY (DRAPES) ×2 IMPLANT
DRAPE INCISE IOBAN 66X45 STRL (DRAPES) ×1 IMPLANT
DRAPE POUCH INSTRU U-SHP 10X18 (DRAPES) ×2 IMPLANT
DRAPE SURG 17X23 STRL (DRAPES) ×8 IMPLANT
DRILL BIT SKYLINE 14MM (BIT) ×1
DRILL NEURO 2X3.1 SOFT TOUCH (MISCELLANEOUS) ×2
DURAPREP 26ML APPLICATOR (WOUND CARE) ×2 IMPLANT
ELECT COATED BLADE 2.86 ST (ELECTRODE) ×2 IMPLANT
ELECT REM PT RETURN 9FT ADLT (ELECTROSURGICAL) ×2
ELECTRODE REM PT RTRN 9FT ADLT (ELECTROSURGICAL) ×1 IMPLANT
EVACUATOR SILICONE 100CC (DRAIN) IMPLANT
FILTER STRAW FLUID ASPIR (MISCELLANEOUS) ×1 IMPLANT
GAUZE 4X4 16PLY ~~LOC~~+RFID DBL (SPONGE) ×2 IMPLANT
GAUZE SPONGE 4X4 12PLY STRL (GAUZE/BANDAGES/DRESSINGS) ×2 IMPLANT
GLOVE SRG 8 PF TXTR STRL LF DI (GLOVE) ×1 IMPLANT
GLOVE SURG ENC MOIS LTX SZ6.5 (GLOVE) ×2 IMPLANT
GLOVE SURG ENC MOIS LTX SZ8 (GLOVE) ×2 IMPLANT
GLOVE SURG UNDER POLY LF SZ7 (GLOVE) ×4 IMPLANT
GLOVE SURG UNDER POLY LF SZ8 (GLOVE) ×1
GOWN STRL REUS W/ TWL LRG LVL3 (GOWN DISPOSABLE) ×1 IMPLANT
GOWN STRL REUS W/ TWL XL LVL3 (GOWN DISPOSABLE) ×1 IMPLANT
GOWN STRL REUS W/TWL LRG LVL3 (GOWN DISPOSABLE) ×1
GOWN STRL REUS W/TWL XL LVL3 (GOWN DISPOSABLE) ×1
GRAFT BNE MATRIX VG FRMBL SM 1 (Bone Implant) IMPLANT
INTERLOCK LRDTC CRVCL VBR 6MM (Bone Implant) IMPLANT
INTERLOCK LRDTC CRVCL VBR 7MM (Bone Implant) IMPLANT
IV CATH 14GX2 1/4 (CATHETERS) ×2 IMPLANT
KIT BASIN OR (CUSTOM PROCEDURE TRAY) ×2 IMPLANT
KIT TURNOVER KIT B (KITS) ×2 IMPLANT
LORDOTIC CERVICAL VBR 6MM SM (Bone Implant) ×2 IMPLANT
LORDOTIC CERVICAL VBR 7MM SM (Bone Implant) ×2 IMPLANT
MANIFOLD NEPTUNE II (INSTRUMENTS) ×2 IMPLANT
NDL PRECISIONGLIDE 27X1.5 (NEEDLE) ×1 IMPLANT
NDL SPNL 20GX3.5 QUINCKE YW (NEEDLE) ×1 IMPLANT
NEEDLE PRECISIONGLIDE 27X1.5 (NEEDLE) ×2 IMPLANT
NEEDLE SPNL 20GX3.5 QUINCKE YW (NEEDLE) ×2 IMPLANT
NS IRRIG 1000ML POUR BTL (IV SOLUTION) ×2 IMPLANT
OIL CARTRIDGE MAESTRO DRILL (MISCELLANEOUS) ×2
PACK ORTHO CERVICAL (CUSTOM PROCEDURE TRAY) ×2 IMPLANT
PAD ARMBOARD 7.5X6 YLW CONV (MISCELLANEOUS) ×4 IMPLANT
PATTIES SURGICAL .5 X.5 (GAUZE/BANDAGES/DRESSINGS) IMPLANT
PATTIES SURGICAL .5 X1 (DISPOSABLE) ×2 IMPLANT
PIN DISTRACTION 14 (PIN) ×2 IMPLANT
PLATE SKYLINE 2 LEVEL 34MM (Plate) ×1 IMPLANT
POSITIONER HEAD DONUT 9IN (MISCELLANEOUS) ×2 IMPLANT
SCREW SKYLINE VAR OS 14MM (Screw) ×6 IMPLANT
SPONGE INTESTINAL PEANUT (DISPOSABLE) ×2 IMPLANT
SPONGE SURGIFOAM ABS GEL 100 (HEMOSTASIS) ×2 IMPLANT
SPONGE T-LAP 4X18 ~~LOC~~+RFID (SPONGE) ×2 IMPLANT
STRIP CLOSURE SKIN 1/2X4 (GAUZE/BANDAGES/DRESSINGS) ×2 IMPLANT
SURGIFLO W/THROMBIN 8M KIT (HEMOSTASIS) IMPLANT
SUT MNCRL AB 4-0 PS2 18 (SUTURE) ×2 IMPLANT
SUT SILK 4 0 (SUTURE)
SUT SILK 4-0 18XBRD TIE 12 (SUTURE) IMPLANT
SUT VIC AB 2-0 CT2 18 VCP726D (SUTURE) ×2 IMPLANT
SYR BULB IRRIG 60ML STRL (SYRINGE) ×2 IMPLANT
SYR CONTROL 10ML LL (SYRINGE) ×6 IMPLANT
TAPE CLOTH 4X10 WHT NS (GAUZE/BANDAGES/DRESSINGS) ×2 IMPLANT
TAPE UMBILICAL COTTON 1/8X30 (MISCELLANEOUS) ×2 IMPLANT
TOWEL GREEN STERILE (TOWEL DISPOSABLE) ×2 IMPLANT
TOWEL GREEN STERILE FF (TOWEL DISPOSABLE) ×2 IMPLANT
WATER STERILE IRR 1000ML POUR (IV SOLUTION) ×2 IMPLANT
YANKAUER SUCT BULB TIP NO VENT (SUCTIONS) ×2 IMPLANT

## 2021-02-28 NOTE — Progress Notes (Signed)
Patient resting quietly in recliner chair continues to report waves on nausea reports is "does pass"  patient ambulated to bathroom without difficulty  and request to sit back in recliner due to feeling "poor" skin warm and dry resp even unlabored

## 2021-02-28 NOTE — Anesthesia Postprocedure Evaluation (Signed)
Anesthesia Post Note  Patient: Gregory Hunter  Procedure(s) Performed: ANTERIOR CERVICAL DECOMPRESSION FUSION CERVICAL 5- CERVICAL 6, CERVICAL 6- CERVICAL 7 WITH INSTRUMENTATION AND ALLOGRAFT (Spine Cervical)     Patient location during evaluation: PACU Anesthesia Type: General Level of consciousness: awake and alert Pain management: pain level controlled Vital Signs Assessment: post-procedure vital signs reviewed and stable Respiratory status: spontaneous breathing, nonlabored ventilation and respiratory function stable Cardiovascular status: blood pressure returned to baseline and stable Postop Assessment: no apparent nausea or vomiting Anesthetic complications: no   No notable events documented.  Last Vitals:  Vitals:   02/28/21 1620 02/28/21 1638  BP: 121/77 125/88  Pulse: 80 69  Resp: 11 11  Temp:  36.5 C  SpO2: 100% 100%    Last Pain:  Vitals:   02/28/21 1638  TempSrc:   PainSc: 5                  Lucretia Kern

## 2021-02-28 NOTE — H&P (Addendum)
° ° ° °  PREOPERATIVE H&P  Chief Complaint: Right arm pain  HPI: Gregory Hunter is a 61 y.o. male who presents with ongoing pain in the right arm and hand  MRI reveals stenosis involving C5/6 and C6/7  Patient has failed multiple forms of conservative care and continues to have pain (see office notes for additional details regarding the patient's full course of treatment)  Past Medical History:  Diagnosis Date   Abnormal EKG    Complication of anesthesia    Vaso-vagals down after procedures   Elevated troponin    Hypertension    Past Surgical History:  Procedure Laterality Date   back fusion  2007   KNEE CARTILAGE SURGERY Bilateral    1978   SHOULDER SURGERY Right 2021   Social History   Socioeconomic History   Marital status: Married    Spouse name: Not on file   Number of children: 3   Years of education: Not on file   Highest education level: Not on file  Occupational History   Occupation: Chemical engineer  Tobacco Use   Smoking status: Former    Types: Cigars   Smokeless tobacco: Never  Vaping Use   Vaping Use: Never used  Substance and Sexual Activity   Alcohol use: Yes    Alcohol/week: 14.0 standard drinks    Types: 14 Standard drinks or equivalent per week   Drug use: No   Sexual activity: Not on file  Other Topics Concern   Not on file  Social History Narrative   Lives with wife.     Social Determinants of Health   Financial Resource Strain: Not on file  Food Insecurity: Not on file  Transportation Needs: Not on file  Physical Activity: Not on file  Stress: Not on file  Social Connections: Not on file   No family history on file. Allergies  Allergen Reactions   Lisinopril Itching   Prior to Admission medications   Medication Sig Start Date End Date Taking? Authorizing Provider  amLODipine (NORVASC) 10 MG tablet Take 1 tablet (10 mg total) by mouth daily. 05/21/20  Yes Rollene Rotunda, MD  Cholecalciferol (VITAMIN D3) 50 MCG (2000 UT) capsule Take  2,000 Units by mouth daily.   Yes [provider]  ezetimibe (ZETIA) 10 MG tablet Take 1 tablet (10 mg total) by mouth daily. 05/24/18  Yes Rollene Rotunda, MD  losartan (COZAAR) 50 MG tablet TAKE 1 TABLET DAILY AS INSTRUCTED BY YOUR PRESCRIBER Patient taking differently: Take 50 mg by mouth daily. 06/14/20  Yes Rollene Rotunda, MD  pravastatin (PRAVACHOL) 80 MG tablet TAKE 1 TABLET DAILY AS INSTRUCTED BY YOUR PRESCRIBER Patient taking differently: Take 80 mg by mouth daily. 06/14/20  Yes Rollene Rotunda, MD     All other systems have been reviewed and were otherwise negative with the exception of those mentioned in the HPI and as above.  Physical Exam: There were no vitals filed for this visit.  There is no height or weight on file to calculate BMI.  General: Alert, no acute distress Cardiovascular: No pedal edema Respiratory: No cyanosis, no use of accessory musculature Skin: No lesions in the area of chief complaint Neurologic: Sensation intact distally Psychiatric: Patient is competent for consent with normal mood and affect Lymphatic: No axillary or cervical lymphadenopathy   Assessment/Plan: RIGHT CERVICAL RADICULOPATHY Plan for Procedure(s): ANTERIOR CERVICAL DECOMPRESSION FUSION CERVICAL 5- CERVICAL 6, CERVICAL 6- CERVICAL 7 WITH INSTRUMENTATION AND ALLOGRAFT   Jackelyn Hoehn, MD 02/28/2021 8:22 AM

## 2021-02-28 NOTE — Op Note (Signed)
PATIENT NAME: Gregory Hunter   MEDICAL RECORD NO.:   [id number]    DATE OF BIRTH: Apr 28, 1960   DATE OF PROCEDURE: 02/28/2021                                OPERATIVE REPORT     PREOPERATIVE DIAGNOSES: 1. Right-sided cervical radiculopathy. 2. Spinal stenosis spanning C5-C7.   POSTOPERATIVE DIAGNOSES: 1. Right-sided cervical radiculopathy. 2. Spinal stenosis spanning C5-C7.   PROCEDURE: 1. Anterior cervical decompression and fusion C5/6, C6/7. 2. Placement of anterior instrumentation, C5-C7. 3. Insertion of interbody device x 2 (Titan intervertebral spacers). 4. Intraoperative use of fluoroscopy. 5. Use of morselized allograft - ViviGen.   SURGEON:  Estill Bamberg, MD   ASSISTANT:  Jason Coop, PA-C.   ANESTHESIA:  General endotracheal anesthesia.   COMPLICATIONS:  None.   DISPOSITION:  Stable.   ESTIMATED BLOOD LOSS:  Minimal.   INDICATIONS FOR SURGERY:  Briefly, Mr. Mehra is a pleasant 61 y.o. year- old patient, who did present to me with severe pain in the neck and right arm.  The patient's MRI did reveal the findings noted above.  Given the patient's ongoing rather debilitating pain and lack of improvement with appropriate treatment measures, we did discuss proceeding with the procedure noted above.  The patient was fully aware of the risks and limitations of surgery as outlined in my preoperative note.   OPERATIVE DETAILS:  On 02/28/2021,  the patient was brought to surgery and general endotracheal anesthesia was administered.  The patient was placed supine on the hospital bed. The neck was gently extended.  All bony prominences were meticulously padded.  The neck was prepped and draped in the usual sterile fashion.  At this point, I did make a left-sided transverse incision.  The platysma was incised.  A Smith-Robinson approach was used and the anterior spine was identified. A self-retaining retractor was placed.  I then subperiosteally exposed the vertebral bodies  from C5-C7.  Caspar pins were then placed into the C6 and C7 vertebral bodies and distraction was applied.  A thorough and complete C6-7 intervertebral diskectomy was performed.  The posterior longitudinal ligament was identified and entered using a nerve hook.  I then used #1 followed by #2 Kerrison to perform a thorough and complete intervertebral diskectomy.  The spinal canal was thoroughly decompressed, as was the right neuroforamen.  The endplates were then prepared and the appropriate-sized intervertebral spacer was then packed with ViviGen and tamped into position in the usual fashion.  The lower Caspar pin was then removed and placed into the C5 vertebral body and once again, distraction was applied across the C5-6 intervertebral space.  I then again performed a thorough and complete diskectomy, thoroughly decompressing the spinal canal and right neuroforamen.  After preparing the endplates, the appropriate-sized intervertebral spacer was packed with ViviGen and tamped into position.  The Caspar pins then were removed and bone wax was placed in their place.  The appropriate-sized anterior cervical plate was placed over the anterior spine.  14 mm variable angle screws were placed, 2 in each vertebral body from C5-C7 for a total of 6 vertebral body screws.  The screws were then locked to the plate using the Cam locking mechanism.  I was very pleased with the final fluoroscopic images.  The wound was then irrigated.  The wound was then explored for any undue bleeding and there was no bleeding noted. The wound was then  closed in layers using 2-0 Vicryl, followed by 4-0 Monocryl.  Benzoin and Steri-Strips were applied, followed by sterile dressing.  All instrument counts were correct at the termination of the procedure.   Of note, Jason Coop, PA-C, was my assistant throughout surgery, and did aid in retraction, suctioning, placement of the hardware, and closure from start to  finish.       Estill Bamberg, MD

## 2021-02-28 NOTE — Anesthesia Procedure Notes (Signed)
Procedure Name: Intubation Date/Time: 02/28/2021 12:55 PM Performed by: Lynnell Chad, CRNA Pre-anesthesia Checklist: Patient identified, Emergency Drugs available, Suction available and Patient being monitored Patient Re-evaluated:Patient Re-evaluated prior to induction Oxygen Delivery Method: Circle System Utilized Preoxygenation: Pre-oxygenation with 100% oxygen Induction Type: IV induction Ventilation: Mask ventilation without difficulty Laryngoscope Size: Glidescope and 4 Grade View: Grade I Tube type: Oral Tube size: 7.5 mm Number of attempts: 1 Airway Equipment and Method: Stylet and Oral airway Placement Confirmation: ETT inserted through vocal cords under direct vision, positive ETCO2 and breath sounds checked- equal and bilateral Secured at: 23 cm Tube secured with: Tape Dental Injury: Teeth and Oropharynx as per pre-operative assessment

## 2021-02-28 NOTE — Transfer of Care (Signed)
Immediate Anesthesia Transfer of Care Note  Patient: Gregory Hunter  Procedure(s) Performed: ANTERIOR CERVICAL DECOMPRESSION FUSION CERVICAL 5- CERVICAL 6, CERVICAL 6- CERVICAL 7 WITH INSTRUMENTATION AND ALLOGRAFT (Spine Cervical)  Patient Location: PACU  Anesthesia Type:General  Level of Consciousness: awake and patient cooperative  Airway & Oxygen Therapy: Patient Spontanous Breathing  Post-op Assessment: Report given to RN and Post -op Vital signs reviewed and stable  Post vital signs: Reviewed and stable  Last Vitals:  Vitals Value Taken Time  BP 125/87 02/28/21 1520  Temp    Pulse 70 02/28/21 1524  Resp 5 02/28/21 1524  SpO2 96 % 02/28/21 1524  Vitals shown include unvalidated device data.  Last Pain:  Vitals:   02/28/21 1005  TempSrc:   PainSc: 2       Patients Stated Pain Goal: 1 (02/28/21 1005)  Complications: No notable events documented.

## 2021-03-01 ENCOUNTER — Encounter (HOSPITAL_COMMUNITY): Payer: Self-pay | Admitting: Orthopedic Surgery

## 2021-04-10 ENCOUNTER — Other Ambulatory Visit (HOSPITAL_BASED_OUTPATIENT_CLINIC_OR_DEPARTMENT_OTHER): Payer: Self-pay | Admitting: *Deleted

## 2021-04-10 DIAGNOSIS — Z01812 Encounter for preprocedural laboratory examination: Secondary | ICD-10-CM

## 2021-04-10 DIAGNOSIS — I10 Essential (primary) hypertension: Secondary | ICD-10-CM

## 2021-04-10 LAB — BASIC METABOLIC PANEL
BUN/Creatinine Ratio: 19 (ref 10–24)
BUN: 20 mg/dL (ref 8–27)
CO2: 26 mmol/L (ref 20–29)
Calcium: 9.5 mg/dL (ref 8.6–10.2)
Chloride: 101 mmol/L (ref 96–106)
Creatinine, Ser: 1.05 mg/dL (ref 0.76–1.27)
Glucose: 85 mg/dL (ref 70–99)
Potassium: 4.7 mmol/L (ref 3.5–5.2)
Sodium: 138 mmol/L (ref 134–144)
eGFR: 81 mL/min/{1.73_m2} (ref >59)

## 2021-04-12 ENCOUNTER — Other Ambulatory Visit: Payer: Self-pay

## 2021-04-12 ENCOUNTER — Other Ambulatory Visit: Payer: Self-pay | Admitting: *Deleted

## 2021-04-12 ENCOUNTER — Ambulatory Visit (INDEPENDENT_AMBULATORY_CARE_PROVIDER_SITE_OTHER)
Admission: RE | Admit: 2021-04-12 | Discharge: 2021-04-12 | Disposition: A | Discharge status: Home / Self Care | Source: Ambulatory Visit | Attending: Cardiology | Admitting: Cardiology

## 2021-04-12 DIAGNOSIS — I719 Aortic aneurysm of unspecified site, without rupture: Secondary | ICD-10-CM | POA: Diagnosis not present

## 2021-04-12 DIAGNOSIS — I7121 Aneurysm of the ascending aorta, without rupture: Secondary | ICD-10-CM

## 2021-04-12 DIAGNOSIS — I712 Thoracic aortic aneurysm, without rupture, unspecified: Secondary | ICD-10-CM

## 2021-04-12 MED ORDER — IOHEXOL 350 MG/ML SOLN
100.0000 mL | Freq: Once | INTRAVENOUS | Status: AC | PRN
  Administered 2021-04-12: 100 mL via INTRAVENOUS

## 2021-04-16 ENCOUNTER — Telehealth: Payer: Self-pay | Admitting: Cardiology

## 2021-04-16 DIAGNOSIS — I10 Essential (primary) hypertension: Secondary | ICD-10-CM

## 2021-04-16 DIAGNOSIS — R931 Abnormal findings on diagnostic imaging of heart and coronary circulation: Secondary | ICD-10-CM

## 2021-04-16 DIAGNOSIS — E785 Hyperlipidemia, unspecified: Secondary | ICD-10-CM

## 2021-04-16 DIAGNOSIS — Z79899 Other long term (current) drug therapy: Secondary | ICD-10-CM

## 2021-04-16 NOTE — Telephone Encounter (Signed)
Patient states he will be having lab work with his PCP the end of march and would like to know if Dr. Antoine Poche will want any labs drawn and if he can just get them all done with his PCP.  ?

## 2021-04-17 NOTE — Telephone Encounter (Signed)
Left detailed message that fasting lab is ordered and can have them done at PCP. Will forward to PCP. ?

## 2021-06-02 DIAGNOSIS — I340 Nonrheumatic mitral (valve) insufficiency: Secondary | ICD-10-CM | POA: Insufficient documentation

## 2021-06-02 NOTE — Progress Notes (Signed)
?  ?Cardiology Office Note ? ? ?Date:  06/03/2021  ? ?ID:  Gregory Hunter, DOB Jun 27, 1960, MRN [id number] ? ?PCP:  Garlan Fillers, MD  ?Cardiologist:   Rollene Rotunda, MD ? ?Chief Complaint  ?Patient presents with  ? Abnormal ECG  ? ? ?  ?History of Present Illness: ?Gregory Hunter is a 61 y.o. male who presents for follow up of an abnormal EKG. He has had this noted when he was in the [employer]. At some point along the way he did have a cardiac catheterization which he says was normal.  He did have a treadmill perfusion study in December 2008 demonstrated normal perfusion is a normal EF. Echocardiogram showed mild LVH that time. He had a negative POET (Plain Old Exercise Treadmill) in 2019. Echocardiogram has demonstrated some mild mitral regurgitation in 2012.   I sent him for a coronary calcium that was 140 and at the 82nd%.  He also has slight aortic enlargement.    ? ?Since I last saw him he had cervical neck surgery.  He has not been able to exercise quite as much because of that.  He was losing weight with diet and exercise prior to that.  He was recently able to do a 250 mile bike ride however.  The patient denies any new symptoms such as chest discomfort, neck or arm discomfort. There has been no new shortness of breath, PND or orthopnea. There have been no reported palpitations, presyncope or syncope. ? ? ?Past Medical History:  ?Diagnosis Date  ? Abnormal EKG   ? Complication of anesthesia   ? Vaso-vagals down after procedures  ? Elevated troponin   ? Hypertension   ? ? ?Past Surgical History:  ?Procedure Laterality Date  ? ANTERIOR CERVICAL DECOMP/DISCECTOMY FUSION N/A 02/28/2021  ? Procedure: ANTERIOR CERVICAL DECOMPRESSION FUSION CERVICAL 5- CERVICAL 6, CERVICAL 6- CERVICAL 7 WITH INSTRUMENTATION AND ALLOGRAFT;  Surgeon: Estill Bamberg, MD;  Location: [facility] OR;  Service: Orthopedics;  Laterality: N/A;  ? back fusion  2007  ? KNEE CARTILAGE SURGERY Bilateral   ? 1978  ? SHOULDER SURGERY Right 2021  ? ? ? ?Current  Outpatient Medications  ?Medication Sig Dispense Refill  ? amLODipine (NORVASC) 10 MG tablet Take 1 tablet (10 mg total) by mouth daily. 90 tablet 3  ? aspirin EC 81 MG tablet Take 1 tablet (81 mg total) by mouth daily. Swallow whole. 30 tablet 11  ? Cholecalciferol (VITAMIN D3) 50 MCG (2000 UT) capsule Take 2,000 Units by mouth daily.    ? ezetimibe (ZETIA) 10 MG tablet Take 1 tablet (10 mg total) by mouth daily. 90 tablet 3  ? losartan (COZAAR) 50 MG tablet TAKE 1 TABLET DAILY AS INSTRUCTED BY YOUR PRESCRIBER (Patient taking differently: Take 50 mg by mouth daily.) 90 tablet 3  ? pravastatin (PRAVACHOL) 80 MG tablet TAKE 1 TABLET DAILY AS INSTRUCTED BY YOUR PRESCRIBER (Patient taking differently: Take 80 mg by mouth daily.) 90 tablet 3  ? HYDROcodone-acetaminophen (NORCO/VICODIN) 5-325 MG tablet Take 1 tablet by mouth every 6 (six) hours as needed for moderate pain or severe pain. 20 tablet 0  ? methocarbamol (ROBAXIN) 500 MG tablet Take 1 tablet (500 mg total) by mouth every 6 (six) hours as needed for muscle spasms. 60 tablet 0  ? tamsulosin (FLOMAX) 0.4 MG CAPS capsule Take 0.4 mg by mouth daily. (Patient not taking: Reported on 06/03/2021)    ? ?No current facility-administered medications for this visit.  ? ? ?Allergies:   Lisinopril  ? ? ?  ROS:  Please see the history of present illness.   Otherwise, review of systems are positive for none.   All other systems are reviewed and negative.  ? ? ?PHYSICAL EXAM: ?VS:  BP (!) 100/58   Pulse 66   Ht 5\' 10"  (1.778 m)   Wt 199 lb (90.3 kg)   SpO2 97%   BMI 28.55 kg/m?  , BMI Body mass index is 28.55 kg/m?. ?GENERAL:  Well appearing ?NECK:  No jugular venous distention, waveform within normal limits, carotid upstroke brisk and symmetric, no bruits, no thyromegaly ?LUNGS:  Clear to auscultation bilaterally ?CHEST:  Unremarkable ?HEART:  PMI not displaced or sustained,S1 and S2 within normal limits, no S3, no S4, no clicks, no rubs, no murmurs ?ABD:  Flat, positive  bowel sounds normal in frequency in pitch, no bruits, no rebound, no guarding, no midline pulsatile mass, no hepatomegaly, no splenomegaly ?EXT:  2 plus pulses throughout, mild bilateral leg edema, no cyanosis no clubbing ? ? ?EKG:  EKG is ordered today. ?Sinus rhythm, rate 66, axis within normal limits, intervals within normal limits, inferolateral T wave inversions unchanged from previous. ? ? ? ?Recent Labs: ?02/26/2021: Hemoglobin 15.6; Platelets 289 ?04/10/2021: BUN 20; Creatinine, Ser 1.05; Potassium 4.7; Sodium 138  ? ? ?Lipid Panel ?   ?Component Value Date/Time  ? CHOL 143 07/02/2018 0809  ? TRIG 59 07/02/2018 0809  ? HDL 72 07/02/2018 0809  ? CHOLHDL 2.0 07/02/2018 0809  ? LDLCALC 59 07/02/2018 0809  ? ?  ? ?Wt Readings from Last 3 Encounters:  ?06/03/21 199 lb (90.3 kg)  ?02/28/21 190 lb (86.2 kg)  ?02/26/21 193 lb 9.6 oz (87.8 kg)  ?  ? ? ?Other studies Reviewed: ?Additional studies/ records that were reviewed today include: Labs. ?Review of the above records demonstrates:  Please see elsewhere in the note.   ? ? ?ASSESSMENT AND PLAN: ? ?Elevated coronary calcium:      His MESA score is 10.5% with the coronary calcium added.    We had a long discussion about aspirin and plus or minus and in this situation I might suggest 81 mg.  He would agree with that.  I am also going to measure an LP(a). ? ?Hypertension:  BP is at target.  No change in therapy.  ? ?Hyperlipidemia:   LDL is excellent per his report and I will get the records from his primary provider.  I will be checking an LP(a) as above. Dilated aorta:  This was 4.2 cm on the CT in March 2023.   I will check repeat CT in March 2025. ? ?MR:   This was mild on echo in 2022.  I will check a repeat echo in 2024.   ? ? ?Current medicines are reviewed at length with the patient today.  The patient does not have concerns regarding medicines. ? ?The following changes have been made:   As above ? ?Labs/ tests ordered today include:   ? ?Orders Placed This  Encounter  ?Procedures  ? CT ANGIO CHEST AORTA W/CM & OR WO/CM  ? Lipoprotein A (LPA)  ? Basic metabolic panel  ? EKG 12-Lead  ? ECHOCARDIOGRAM COMPLETE  ? ? ? ?Disposition:   FU with me in 12 months. ? ? ?Signed, ?2025, MD  ?06/03/2021 10:47 AM    ?[facility] ? ? ? ?

## 2021-06-03 ENCOUNTER — Ambulatory Visit: Admitting: Cardiology

## 2021-06-03 ENCOUNTER — Other Ambulatory Visit: Payer: Self-pay | Admitting: Cardiology

## 2021-06-03 ENCOUNTER — Encounter: Payer: Self-pay | Admitting: Cardiology

## 2021-06-03 VITALS — BP 100/58 | HR 66 | Ht 70.0 in | Wt 199.0 lb

## 2021-06-03 DIAGNOSIS — R931 Abnormal findings on diagnostic imaging of heart and coronary circulation: Secondary | ICD-10-CM

## 2021-06-03 DIAGNOSIS — I10 Essential (primary) hypertension: Secondary | ICD-10-CM | POA: Diagnosis not present

## 2021-06-03 DIAGNOSIS — E785 Hyperlipidemia, unspecified: Secondary | ICD-10-CM | POA: Diagnosis not present

## 2021-06-03 DIAGNOSIS — I340 Nonrheumatic mitral (valve) insufficiency: Secondary | ICD-10-CM | POA: Diagnosis not present

## 2021-06-03 DIAGNOSIS — I77810 Thoracic aortic ectasia: Secondary | ICD-10-CM

## 2021-06-03 DIAGNOSIS — Z01812 Encounter for preprocedural laboratory examination: Secondary | ICD-10-CM

## 2021-06-03 MED ORDER — ASPIRIN EC 81 MG PO TBEC: 81.0000 mg | DELAYED_RELEASE_TABLET | Freq: Every day | ORAL | 11 refills | Status: AC

## 2021-06-03 NOTE — Patient Instructions (Addendum)
Medication Instructions:  ?START: Aspirin 81 mg daily ? ?*If you need a refill on your cardiac medications before your next appointment, please call your pharmacy* ? ? ?Lab Work: ?Your physician recommends lab work today (LPa), and then 1 week prior to CTA in 2025 ? ?If you have labs (blood work) drawn today and your tests are completely normal, you will receive your results only by: ?MyChart Message (if you have MyChart) OR ?A paper copy in the mail ?If you have any lab test that is abnormal or we need to change your treatment, we will call you to review the results. ? ? ?Testing/Procedures: ?Your physician has requested that you have an echocardiogram in March, 2024. Echocardiography is a painless test that uses sound waves to create images of your heart. It provides your doctor with information about the size and shape of your heart and how well your heart?s chambers and valves are working. This procedure takes approximately one hour. There are no restrictions for this procedure. ?[address] ? ?CT Aorta (March, 2025) ? ? ? ? ?Follow-Up: ?At [facility], you and your health needs are our priority.  As part of our continuing mission to provide you with exceptional heart care, we have created designated Provider Care Teams.  These Care Teams include your primary Cardiologist (physician) and Advanced Practice Providers (APPs -  Physician Assistants and Nurse Practitioners) who all work together to provide you with the care you need, when you need it. ? ?We recommend signing up for the patient portal called "MyChart".  Sign up information is provided on this After Visit Summary.  MyChart is used to connect with patients for Virtual Visits (Telemedicine).  Patients are able to view lab/test results, encounter notes, upcoming appointments, etc.  Non-urgent messages can be sent to your provider as well.   ?To learn more about what you can do with MyChart, go to [email or web address].   ? ?Your  next appointment:   ?1 year(s) ? ?The format for your next appointment:   ?In Person ? ?Provider:   ?Rollene Rotunda, MD   ? ? ?Important Information About Sugar ? ? ? ? ? ? ?

## 2021-06-04 ENCOUNTER — Encounter: Payer: Self-pay | Admitting: Cardiology

## 2021-06-04 LAB — LIPOPROTEIN A (LPA): Lipoprotein (a): 38.7 nmol/L (ref <75.0)

## 2021-08-22 ENCOUNTER — Other Ambulatory Visit: Payer: Self-pay | Admitting: Cardiology

## 2022-02-10 HISTORY — PX: INGUINAL HERNIA REPAIR: SHX194

## 2022-02-24 ENCOUNTER — Encounter: Payer: Self-pay | Admitting: Cardiology

## 2022-02-27 ENCOUNTER — Encounter (HOSPITAL_BASED_OUTPATIENT_CLINIC_OR_DEPARTMENT_OTHER): Payer: Self-pay | Admitting: Family

## 2022-02-27 NOTE — Progress Notes (Signed)
Cardiology Office Note:    Date:  02/28/2022   ID:  Gregory Hunter, DOB [birth year], MRN [id number]  PCP:  Garlan Fillers, MD   [facility] Providers Cardiologist:  Rollene Rotunda, MD     Referring MD: Garlan Fillers, MD   Chief Complaint  Patient presents with   decreased exercise tolerance    Seen for Dr. Antoine Poche   History of Present Illness:    Gregory Hunter is a 62 y.o. male with a hx of elevated coronary artery calcium score, hypertension, hyperlipidemia, dilated aortic root, nonrheumatic mitral regurgitation.  In his past history he was noted to have an abnormal EKG while he was in the [employer].  At some point he reports he had a cardiac catheterization which he says was normal.  He did have a treadmill perfusion study 01/2007 that demonstrated abnormal perfusion with mild LVH at that time.  Was last seen in the office on 06/03/2021 by Dr. Antoine Poche, based on his recent elevated coronary calcium score aspirin 81 mg/day was added.  Gregory Hunter called into our office on 02/24/22 with concerns of a notable decrease in his ability to exercise over the last year.  He has even had an episode of chest pain while he was walking a quarter of a mile.  He presents today with concerns of decreased exercise tolerance. Notably, over the last year he feels he is not able to keep up with his peers as well as he used to when they are exercising. He had an ACDF in January 2023 and was not able to exercise for several months and is not sure if this is playing into his decreased exercise capacity or not. His apple watch has been alerting him that he may not be as fit as others in his age range. Last week while walking in a parking lot (up hill), he noted chest pain across his entire upper chest that lasted ~ 1 minute. Pain subsided when he reached the top of the incline. Of note, he was recovering from a recent URI, temp was ~ 30 degrees and he was breathing through his mouth. He returned to the gym a  few days after this incident and was able to work out without any reproducible symptoms. He denies palpitations, dyspnea, pnd, orthopnea, n, v, dizziness, syncope, edema, weight gain, or early satiety. He has an upcoming echo complete and CT angion chest/aorta for surveillance of his MR and dilatated aorta.    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   42 mm   Complication of anesthesia    Vaso-vagals down after procedures   Elevated troponin    Hypertension    Mild left ventricular hypertrophy 05/07/2020   Mild mitral regurgitation 05/07/2020    Past Surgical History:  Procedure Laterality Date   ANTERIOR CERVICAL DECOMP/DISCECTOMY FUSION N/A 02/28/2021   Procedure: ANTERIOR CERVICAL DECOMPRESSION FUSION CERVICAL 5- CERVICAL 6, CERVICAL 6- CERVICAL 7 WITH INSTRUMENTATION AND ALLOGRAFT;  Surgeon: Estill Bamberg, MD;  Location: [facility] OR;  Service: Orthopedics;  Laterality: N/A;   back fusion  2007   KNEE CARTILAGE SURGERY Bilateral    1978   SHOULDER SURGERY Right 2021    Current Medications: Current Meds  Medication Sig   amLODipine (NORVASC) 10 MG tablet TAKE 1 TABLET DAILY (PLEASE SCHEDULE AN APPOINTMENT FOR FURTHER REFILLS)   aspirin EC 81 MG tablet Take 1 tablet (81 mg total) by mouth daily. Swallow whole.  Cholecalciferol (VITAMIN D3) 50 MCG (2000 UT) capsule Take 2,000 Units by mouth daily.   ezetimibe (ZETIA) 10 MG tablet Take 1 tablet (10 mg total) by mouth daily.   losartan (COZAAR) 50 MG tablet TAKE 1 TABLET DAILY AS INSTRUCTED BY YOUR PRESCRIBER   methocarbamol (ROBAXIN) 500 MG tablet Take 1 tablet (500 mg total) by mouth every 6 (six) hours as needed for muscle spasms.   pravastatin (PRAVACHOL) 80 MG tablet TAKE 1 TABLET DAILY AS INSTRUCTED BY YOUR PRESCRIBER     Allergies:   Lisinopril   Social History   Socioeconomic History   Marital status: Married    Spouse name: Not on file   Number of children: 3    Years of education: Not on file   Highest education level: Not on file  Occupational History   Occupation: Press photographer  Tobacco Use   Smoking status: Former    Types: Cigars   Smokeless tobacco: Never  Vaping Use   Vaping Use: Never used  Substance and Sexual Activity   Alcohol use: Yes    Alcohol/week: 14.0 standard drinks of alcohol    Types: 14 Standard drinks or equivalent per week   Drug use: No   Sexual activity: Not on file  Other Topics Concern   Not on file  Social History Narrative   Lives with wife.     Social Determinants of Health   Financial Resource Strain: Not on file  Food Insecurity: Not on file  Transportation Needs: Not on file  Physical Activity: Not on file  Stress: Not on file  Social Connections: Not on file     Family History: The patient's family history is not on file.  ROS:   Please see the history of present illness.    All other systems reviewed and are negative.  EKGs/Labs/Other Studies Reviewed:    The following studies were reviewed today:  05/07/20 echo complete -EF 70 to 75%, mild LVH, mild MR, aortic dilatation noted (42 mm), trivial TR.   07/30/19 ETT - Blood pressure demonstrated a normal response to exercise. No T wave inversion was noted during stress. There was no ST segment deviation noted during stress. Arrhythmias during stress: rare PVCs. Overall, the patient's exercise capacity was excellent Duke Treadmill Score: low risk  07/29/17 Coronary Calcium Score- Ascending Aorta: Dilated 4.3 cm   Pericardium: Normal   Coronary arteries: Calcium noted in RCA and LAD   IMPRESSION: Coronary calcium score of 140 . This was 86 nd percentile for age and sex matched control.   EKG:  EKG is ordered today.  The ekg ordered today demonstrates SR with non-specific t wave abnormality, HR 71 bpm. Unchanged from previous tracings.  Recent Labs: 04/10/2021: BUN 20; Creatinine, Ser 1.05; Potassium 4.7; Sodium 138  Recent  Lipid Panel    Component Value Date/Time   CHOL 143 07/02/2018 0809   TRIG 59 07/02/2018 0809   HDL 72 07/02/2018 0809   CHOLHDL 2.0 07/02/2018 0809   LDLCALC 59 07/02/2018 0809     Risk Assessment/Calculations:                Physical Exam:    VS:  BP 122/80   Pulse 71   Ht 5\' 10"  (1.778 m)   Wt 201 lb (91.2 kg)   BMI 28.84 kg/m     Wt Readings from Last 3 Encounters:  02/28/22 201 lb (91.2 kg)  06/03/21 199 lb (90.3 kg)  02/28/21 190 lb (86.2 kg)  GEN:  Well nourished, well developed in no acute distress HEENT: Normal/ NECK: No JVD; No carotid bruits LYMPHATICS: No lymphadenopathy CARDIAC: RRR, no murmurs, rubs, gallops RESPIRATORY:  Clear to auscultation without rales, wheezing or rhonchi  ABDOMEN: Soft, non-tender, non-distended MUSCULOSKELETAL:  No edema; No deformity  SKIN: Warm and dry NEUROLOGIC:  Alert and oriented x 3 PSYCHIATRIC:  Normal affect   ASSESSMENT:    1. Decreased exercise tolerance   2. Coronary artery calcification   3. Elevated coronary artery calcium score   4. Ascending aorta dilatation (HCC)   5. Non-rheumatic mitral regurgitation    PLAN:    In order of problems listed above:  Decreased exercise tolerance - This has been bothersome over the last year. He continues to ride his bike for long distances, but notices that he cannot keep up as good as he used to. Denies CP, SOB, no changes noted to his EKG. Known calcification on prior CT's. Discussed options for further ischemic evaluation, he would like to proceed with exercise myoview. Check CBC, BMET and TSH for organic causes.   HTN - BP today is 122/80, well controlled. Continue with current antihypertensive regimen.   Elevated CA calcium score/coronary artery calcification - Proceed with myoview as above. Continue statin, ASA.    Ascending aorta dilatation -  BP is well managed. Has upcoming CT angio chest/aorta on 04/10/22.  Mild mitral regurgitation - repeat echo  scheduled 04/15/22.       Shared Decision Making/Informed Consent The risks [chest pain, shortness of breath, cardiac arrhythmias, dizziness, blood pressure fluctuations, myocardial infarction, stroke/transient ischemic attack, nausea, vomiting, allergic reaction, radiation exposure, metallic taste sensation and life-threatening complications (estimated to be 1 in 10,000)], benefits (risk stratification, diagnosing coronary artery disease, treatment guidance) and alternatives of a nuclear stress test were discussed in detail with Gregory Hunter and he agrees to proceed.    Medication Adjustments/Labs and Tests Ordered: Current medicines are reviewed at length with the patient today.  Concerns regarding medicines are outlined above.  Orders Placed This Encounter  Procedures   CBC   Basic metabolic panel   TSH   MYOCARDIAL PERFUSION IMAGING   EKG 12-Lead   No orders of the defined types were placed in this encounter.   Patient Instructions  Medication Instructions:  Your Physician recommend you continue on your current medication as directed.    *If you need a refill on your cardiac medications before your next appointment, please call your pharmacy*   Lab Work: Your physician recommends that you return for lab work Today- CBC, TSH, BMP  If you have labs (blood work) drawn today and your tests are completely normal, you will receive your results only by: MyChart Message (if you have MyChart) OR A paper copy in the mail If you have any lab test that is abnormal or we need to change your treatment, we will call you to review the results.   Testing/Procedures: Echo and CT as scheduled   Your Physician has requested you have a Exercise Lexi (stress test)  Please arrive 15 minutes prior to your appointment time for registration and insurance purposes.   The test will take approximately 3 to 4 hours to complete; you may bring reading material. If someone comes with you to your  appointment, they will need to remain in the main lobby due to limited testing space in the testing area. **If you are pregnant or breastfeeding, please notify the nuclear lab prior to your appointment**   How to  prepare for your test:  - Do not eat or drink 3 hours prior to your test, except you may have water  - Do not consume products containing caffeine ( regular or decaf) 12 hours prior to your test ( coffee, chocolate, sodas or teas)  - Do bring a list of your current medications with you. If not listed below, you may take your medications as normal (Hold beta blocker- 24 hour prior for exercise myoview)   - Do wear comfortable clothes (no dresses or overalls) and walking shoes ( tennis shoes preferred), no heel or open toe shoes are allowed - Do not wear cologne, perfume, aftershave, or lotions ( deodorant is allowed)  - If these instructions are not followed, your test will be rescheduled   If you cannot keep your appointment, please provide 24 hours notice to the Nuclear Lab, to avoid a possible $50 charge to your account!     Follow-Up: At [facility], you and your health needs are our priority.  As part of our continuing mission to provide you with exceptional heart care, we have created designated Provider Care Teams.  These Care Teams include your primary Cardiologist (physician) and Advanced Practice Providers (APPs -  Physician Assistants and Nurse Practitioners) who all work together to provide you with the care you need, when you need it.  We recommend signing up for the patient portal called "MyChart".  Sign up information is provided on this After Visit Summary.  MyChart is used to connect with patients for Virtual Visits (Telemedicine).  Patients are able to view lab/test results, encounter notes, upcoming appointments, etc.  Non-urgent messages can be sent to your provider as well.   To learn more about what you can do with MyChart, go to [email or web address].     Your next appointment:   Please follow up with Dr. Percival Spanish in March after testing!     Signed, Trudi Ida, NP  02/28/2022 12:00 PM    [place]

## 2022-02-28 ENCOUNTER — Encounter (HOSPITAL_BASED_OUTPATIENT_CLINIC_OR_DEPARTMENT_OTHER): Payer: Self-pay | Admitting: Cardiology

## 2022-02-28 ENCOUNTER — Ambulatory Visit (HOSPITAL_BASED_OUTPATIENT_CLINIC_OR_DEPARTMENT_OTHER): Admitting: Cardiology

## 2022-02-28 VITALS — BP 122/80 | HR 71 | Ht 70.0 in | Wt 201.0 lb

## 2022-02-28 DIAGNOSIS — I77810 Thoracic aortic ectasia: Secondary | ICD-10-CM

## 2022-02-28 DIAGNOSIS — R6889 Other general symptoms and signs: Secondary | ICD-10-CM | POA: Diagnosis not present

## 2022-02-28 DIAGNOSIS — I2510 Atherosclerotic heart disease of native coronary artery without angina pectoris: Secondary | ICD-10-CM

## 2022-02-28 DIAGNOSIS — I340 Nonrheumatic mitral (valve) insufficiency: Secondary | ICD-10-CM | POA: Diagnosis not present

## 2022-02-28 DIAGNOSIS — I2584 Coronary atherosclerosis due to calcified coronary lesion: Secondary | ICD-10-CM

## 2022-02-28 DIAGNOSIS — R931 Abnormal findings on diagnostic imaging of heart and coronary circulation: Secondary | ICD-10-CM

## 2022-02-28 NOTE — Patient Instructions (Signed)
Medication Instructions:  Your Physician recommend you continue on your current medication as directed.    *If you need a refill on your cardiac medications before your next appointment, please call your pharmacy*   Lab Work: Your physician recommends that you return for lab work Today- CBC, TSH, BMP  If you have labs (blood work) drawn today and your tests are completely normal, you will receive your results only by: [place] (if you have [place]) OR A paper copy in the mail If you have any lab test that is abnormal or we need to change your treatment, we will call you to review the results.   Testing/Procedures: Echo and CT as scheduled   Your Physician has requested you have a Exercise Lexi (stress test)  Please arrive 15 minutes prior to your appointment time for registration and insurance purposes.   The test will take approximately 3 to 4 hours to complete; you may bring reading material. If someone comes with you to your appointment, they will need to remain in the main lobby due to limited testing space in the testing area. **If you are pregnant or breastfeeding, please notify the nuclear lab prior to your appointment**   How to prepare for your test:  - Do not eat or drink 3 hours prior to your test, except you may have water  - Do not consume products containing caffeine ( regular or decaf) 12 hours prior to your test ( coffee, chocolate, sodas or teas)  - Do bring a list of your current medications with you. If not listed below, you may take your medications as normal (Hold beta blocker- 24 hour prior for exercise myoview)   - Do wear comfortable clothes (no dresses or overalls) and walking shoes ( tennis shoes preferred), no heel or open toe shoes are allowed - Do not wear cologne, perfume, aftershave, or lotions ( deodorant is allowed)  - If these instructions are not followed, your test will be rescheduled   If you cannot keep your appointment, please provide 24  hours notice to the Nuclear Lab, to avoid a possible $50 charge to your account!     Follow-Up: At [facility], you and your health needs are our priority.  As part of our continuing mission to provide you with exceptional heart care, we have created designated Provider Care Teams.  These Care Teams include your primary Cardiologist (physician) and Advanced Practice Providers (APPs -  Physician Assistants and Nurse Practitioners) who all work together to provide you with the care you need, when you need it.  We recommend signing up for the patient portal called "MyChart".  Sign up information is provided on this After Visit Summary.  MyChart is used to connect with patients for Virtual Visits (Telemedicine).  Patients are able to view lab/test results, encounter notes, upcoming appointments, etc.  Non-urgent messages can be sent to your provider as well.   To learn more about what you can do with MyChart, go to [email or web address].    Your next appointment:   Please follow up with Dr. Percival Spanish in March after testing!

## 2022-03-01 LAB — BASIC METABOLIC PANEL WITH GFR
BUN/Creatinine Ratio: 16 (ref 10–24)
BUN: 17 mg/dL (ref 8–27)
CO2: 24 mmol/L (ref 20–29)
Calcium: 9.6 mg/dL (ref 8.6–10.2)
Chloride: 101 mmol/L (ref 96–106)
Creatinine, Ser: 1.05 mg/dL (ref 0.76–1.27)
Glucose: 93 mg/dL (ref 70–99)
Potassium: 5 mmol/L (ref 3.5–5.2)
Sodium: 139 mmol/L (ref 134–144)
eGFR: 81 mL/min/1.73

## 2022-03-01 LAB — CBC
Hematocrit: 48.1 % (ref 37.5–51.0)
Hemoglobin: 15.7 g/dL (ref 13.0–17.7)
MCH: 30.3 pg (ref 26.6–33.0)
MCHC: 32.6 g/dL (ref 31.5–35.7)
MCV: 93 fL (ref 79–97)
Platelets: 308 x10E3/uL (ref 150–450)
RBC: 5.18 x10E6/uL (ref 4.14–5.80)
RDW: 13.1 % (ref 11.6–15.4)
WBC: 4.6 x10E3/uL (ref 3.4–10.8)

## 2022-03-01 LAB — TSH: TSH: 1.39 u[IU]/mL (ref 0.450–4.500)

## 2022-03-03 ENCOUNTER — Telehealth (HOSPITAL_COMMUNITY): Payer: Self-pay | Admitting: *Deleted

## 2022-03-03 NOTE — Telephone Encounter (Signed)
Patient given detailed instructions per Myocardial Perfusion Study Information Sheet for the test on 03/05/22  Patient notified to arrive 15 minutes early and that it is imperative to arrive on time for appointment to keep from having the test rescheduled.  If you need to cancel or reschedule your appointment, please call the office within 24 hours of your appointment. . Patient verbalized understanding. Octave Montrose Jacqueline, RN   

## 2022-03-05 ENCOUNTER — Ambulatory Visit (HOSPITAL_COMMUNITY): Attending: Cardiology

## 2022-03-05 DIAGNOSIS — I77810 Thoracic aortic ectasia: Secondary | ICD-10-CM

## 2022-03-05 DIAGNOSIS — R931 Abnormal findings on diagnostic imaging of heart and coronary circulation: Secondary | ICD-10-CM | POA: Diagnosis present

## 2022-03-05 DIAGNOSIS — I2510 Atherosclerotic heart disease of native coronary artery without angina pectoris: Secondary | ICD-10-CM | POA: Diagnosis present

## 2022-03-05 DIAGNOSIS — I2584 Coronary atherosclerosis due to calcified coronary lesion: Secondary | ICD-10-CM | POA: Diagnosis present

## 2022-03-05 DIAGNOSIS — R6889 Other general symptoms and signs: Secondary | ICD-10-CM | POA: Diagnosis not present

## 2022-03-05 DIAGNOSIS — I340 Nonrheumatic mitral (valve) insufficiency: Secondary | ICD-10-CM | POA: Diagnosis not present

## 2022-03-05 LAB — MYOCARDIAL PERFUSION IMAGING
Estimated workload: 8.9
Exercise duration (min): 7 min
Exercise duration (sec): 16 s
LV dias vol: 96 mL (ref 62–150)
LV sys vol: 40 mL
MPHR: 159 {beats}/min
Nuc Stress EF: 58 %
Peak HR: 141 {beats}/min
Percent HR: 88 %
Rest HR: 60 {beats}/min
Rest Nuclear Isotope Dose: 10.7 mCi
SDS: 1
SRS: 0
SSS: 1
ST Depression (mm): 0 mm
Stress Nuclear Isotope Dose: 31.2 mCi
TID: 1.04

## 2022-03-05 MED ORDER — TECHNETIUM TC 99M TETROFOSMIN IV KIT
10.7000 | PACK | Freq: Once | INTRAVENOUS | Status: AC | PRN
  Administered 2022-03-05: 10.7 via INTRAVENOUS

## 2022-03-05 MED ORDER — TECHNETIUM TC 99M TETROFOSMIN IV KIT
31.2000 | PACK | Freq: Once | INTRAVENOUS | Status: AC | PRN
  Administered 2022-03-05: 31.2 via INTRAVENOUS

## 2022-03-28 ENCOUNTER — Telehealth: Payer: Self-pay | Admitting: Cardiology

## 2022-03-28 ENCOUNTER — Encounter: Payer: Self-pay | Admitting: *Deleted

## 2022-03-28 DIAGNOSIS — E785 Hyperlipidemia, unspecified: Secondary | ICD-10-CM

## 2022-03-28 DIAGNOSIS — R931 Abnormal findings on diagnostic imaging of heart and coronary circulation: Secondary | ICD-10-CM

## 2022-03-28 NOTE — Telephone Encounter (Signed)
Patient wants to know if he will need to do blood work prior to his OV on 3/20.  Patient stated can leave message in MyChart as he will be travelling.

## 2022-03-28 NOTE — Telephone Encounter (Signed)
Routed to MD/RN to review

## 2022-03-28 NOTE — Telephone Encounter (Signed)
Patient will need fasting lipid profile prior to appointment. Message sent to patient via my chart. Lab orders mailed to the pt

## 2022-04-09 LAB — LIPID PANEL
Chol/HDL Ratio: 2.1 ratio (ref 0.0–5.0)
Cholesterol, Total: 150 mg/dL (ref 100–199)
HDL: 71 mg/dL (ref >39)
LDL Chol Calc (NIH): 64 mg/dL (ref 0–99)
Triglycerides: 80 mg/dL (ref 0–149)
VLDL Cholesterol Cal: 15 mg/dL (ref 5–40)

## 2022-04-10 ENCOUNTER — Ambulatory Visit (HOSPITAL_COMMUNITY)
Admission: RE | Admit: 2022-04-10 | Discharge: 2022-04-10 | Disposition: A | Discharge status: Home / Self Care | Source: Ambulatory Visit | Attending: Cardiology | Admitting: Cardiology

## 2022-04-10 ENCOUNTER — Ambulatory Visit (HOSPITAL_COMMUNITY)

## 2022-04-10 DIAGNOSIS — I77810 Thoracic aortic ectasia: Secondary | ICD-10-CM | POA: Insufficient documentation

## 2022-04-10 MED ORDER — IOHEXOL 350 MG/ML SOLN
100.0000 mL | Freq: Once | INTRAVENOUS | Status: AC | PRN
  Administered 2022-04-10: 80 mL via INTRAVENOUS

## 2022-04-10 MED ORDER — SODIUM CHLORIDE (PF) 0.9 % IJ SOLN
INTRAMUSCULAR | Status: AC
  Filled 2022-04-10: qty 50

## 2022-04-14 ENCOUNTER — Encounter: Payer: Self-pay | Admitting: *Deleted

## 2022-04-15 ENCOUNTER — Ambulatory Visit (HOSPITAL_COMMUNITY): Attending: Cardiovascular Disease

## 2022-04-15 DIAGNOSIS — I340 Nonrheumatic mitral (valve) insufficiency: Secondary | ICD-10-CM

## 2022-04-15 LAB — ECHOCARDIOGRAM COMPLETE
Area-P 1/2: 3.42 cm2
MV M vel: 5.29 m/s
MV Peak grad: 111.9 mmHg
Radius: 0.85 cm
S' Lateral: 2.6 cm

## 2022-04-28 NOTE — Progress Notes (Unsigned)
Cardiology Office Note   Date:  04/30/2022   ID:  Gregory, Hunter 1960/10/05, MRN [id number]  PCP:  Donnajean Lopes, MD  Cardiologist:   Minus Breeding, MD  Chief Complaint  Patient presents with   Chest Pain      History of Present Illness: Gregory Hunter is a 62 y.o. male who presents for follow up of an abnormal EKG. He has had this noted when he was in the [employer]. At some point along the way he did have a cardiac catheterization which he says was normal.  He did have a treadmill perfusion study in December 2008 demonstrated normal perfusion is a normal EF. Echocardiogram showed mild LVH that time. He had a negative POET (Plain Old Exercise Treadmill) in 2019. Echocardiogram has demonstrated some mild mitral regurgitation in 2012.   I sent him for a coronary calcium that was 140 and at the 82nd%.  He also has slight aortic enlargement.      Perfusion study was negative for ischemia or infarct.  This study was done in January because he had an episode of chest pain.  Echo in March 2024 demonstrated stable moderate MR.    Since he was seen in January he has been physically active.  He exercises routinely.  He has not been getting any chest discomfort, neck or arm discomfort.  Has had no new shortness of breath, PND or orthopnea.  He has no palpitations, presyncope or syncope.   Past Medical History:  Diagnosis Date   Abnormal EKG    Ascending aorta dilatation (HCC) [id number]   42 mm   Complication of anesthesia    Vaso-vagals down after procedures   Elevated troponin    Hypertension    Mild left ventricular hypertrophy 05/07/2020   Mild mitral regurgitation 05/07/2020    Past Surgical History:  Procedure Laterality Date   ANTERIOR CERVICAL DECOMP/DISCECTOMY FUSION N/A 02/28/2021   Procedure: ANTERIOR CERVICAL DECOMPRESSION FUSION CERVICAL 5- CERVICAL 6, CERVICAL 6- CERVICAL 7 WITH INSTRUMENTATION AND ALLOGRAFT;  Surgeon: Phylliss Bob, MD;  Location: [place];  Service:  Orthopedics;  Laterality: N/A;   back fusion  2007   KNEE CARTILAGE SURGERY Bilateral    1978   SHOULDER SURGERY Right 2021     Current Outpatient Medications  Medication Sig Dispense Refill   amLODipine (NORVASC) 10 MG tablet TAKE 1 TABLET DAILY (PLEASE SCHEDULE AN APPOINTMENT FOR FURTHER REFILLS) 90 tablet 3   aspirin EC 81 MG tablet Take 1 tablet (81 mg total) by mouth daily. Swallow whole. 30 tablet 11   Cholecalciferol (VITAMIN D3) 50 MCG (2000 UT) capsule Take 2,000 Units by mouth daily.     ezetimibe (ZETIA) 10 MG tablet Take 1 tablet (10 mg total) by mouth daily. 90 tablet 3   losartan (COZAAR) 50 MG tablet TAKE 1 TABLET DAILY AS INSTRUCTED BY YOUR PRESCRIBER 90 tablet 3   methocarbamol (ROBAXIN) 500 MG tablet Take 1 tablet (500 mg total) by mouth every 6 (six) hours as needed for muscle spasms. 60 tablet 0   pravastatin (PRAVACHOL) 80 MG tablet TAKE 1 TABLET DAILY AS INSTRUCTED BY YOUR PRESCRIBER 90 tablet 3   No current facility-administered medications for this visit.    Allergies:   Lisinopril    ROS:  Please see the history of present illness.   Otherwise, review of systems are positive for none.   All other systems are reviewed and negative.    PHYSICAL EXAM: VS:  BP 122/80   Pulse  67   Ht 5\' 10"  (1.778 m)   Wt 201 lb (91.2 kg)   SpO2 98%   BMI 28.84 kg/m  , BMI Body mass index is 28.84 kg/m. GENERAL:  Well appearing NECK:  No jugular venous distention, waveform within normal limits, carotid upstroke brisk and symmetric, no bruits, no thyromegaly LUNGS:  Clear to auscultation bilaterally CHEST:  Unremarkable HEART:  PMI not displaced or sustained,S1 and S2 within normal limits, no S3, no S4, no clicks, no rubs, no murmurs ABD:  Flat, positive bowel sounds normal in frequency in pitch, no bruits, no rebound, no guarding, no midline pulsatile mass, no hepatomegaly, no splenomegaly EXT:  2 plus pulses throughout, no edema, no cyanosis no clubbing   EKG:  EKG is  not ordered today.   Recent Labs: 02/28/2022: BUN 17; Creatinine, Ser 1.05; Hemoglobin 15.7; Platelets 308; Potassium 5.0; Sodium 139; TSH 1.390    Lipid Panel    Component Value Date/Time   CHOL 150 04/08/2022 0802   TRIG 80 04/08/2022 0802   HDL 71 04/08/2022 0802   CHOLHDL 2.1 04/08/2022 0802   LDLCALC 64 04/08/2022 0802      Wt Readings from Last 3 Encounters:  04/30/22 201 lb (91.2 kg)  03/05/22 201 lb (91.2 kg)  02/28/22 201 lb (91.2 kg)      Other studies Reviewed: Additional studies/ records that were reviewed today include: Stress test.  Labs Review of the above records demonstrates:  Please see elsewhere in the note.     ASSESSMENT AND PLAN:  Elevated coronary calcium:    Stress test was negative.  We had a long discussion about lifestyle and continued risk reduction.  He had no new symptoms      his MESA score is 10.5% with the coronary calcium added.    He will continue with current therapy.  Hypertension:  His BP was at target.  No change in therapy.  Hyperlipidemia:   LDL is 64 with an HDL of 71.  In about 4 months I would like him to check an NMR profile.   MR:   This was moderate I will follow-up with repeat echo in 1 year.    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  I will follow-up CT in 1 year.  Current medicines are reviewed at length with the patient today.  The patient does not have concerns regarding medicines.  The following changes have been made:  None  Labs/ tests ordered today include:   None  Orders Placed This Encounter  Procedures   NMR, lipoprofile   ECHOCARDIOGRAM COMPLETE     Disposition:   FU with me in 12 months.   Signed, Minus Breeding, MD  04/30/2022 11:01 AM    [place]

## 2022-04-30 ENCOUNTER — Ambulatory Visit: Attending: Cardiology | Admitting: Cardiology

## 2022-04-30 ENCOUNTER — Encounter: Payer: Self-pay | Admitting: Cardiology

## 2022-04-30 VITALS — BP 122/80 | HR 67 | Ht 70.0 in | Wt 201.0 lb

## 2022-04-30 DIAGNOSIS — E785 Hyperlipidemia, unspecified: Secondary | ICD-10-CM

## 2022-04-30 DIAGNOSIS — R931 Abnormal findings on diagnostic imaging of heart and coronary circulation: Secondary | ICD-10-CM | POA: Diagnosis not present

## 2022-04-30 DIAGNOSIS — I10 Essential (primary) hypertension: Secondary | ICD-10-CM | POA: Diagnosis not present

## 2022-04-30 DIAGNOSIS — I340 Nonrheumatic mitral (valve) insufficiency: Secondary | ICD-10-CM | POA: Diagnosis not present

## 2022-04-30 MED ORDER — AMLODIPINE BESYLATE 10 MG PO TABS: 10.0000 mg | ORAL_TABLET | Freq: Every day | ORAL | 3 refills | Status: DC

## 2022-04-30 NOTE — Patient Instructions (Signed)
Medication Instructions:  Your physician recommends that you continue on your current medications as directed. Please refer to the Current Medication list given to you today.  *If you need a refill on your cardiac medications before your next appointment, please call your pharmacy*   Lab Work: Your physician recommends that you return for a FASTING NMR Lipoprofile in 3-4 months (June-July).  If you have labs (blood work) drawn today and your tests are completely normal, you will receive your results only by: [place] (if you have MyChart) OR A paper copy in the mail If you have any lab test that is abnormal or we need to change your treatment, we will call you to review the results.   Testing/Procedures: Your physician has requested that you have an echocardiogram in 1 year--prior to follow-up appointment with Dr. Percival Spanish. Echocardiography is a painless test that uses sound waves to create images of your heart. It provides your doctor with information about the size and shape of your heart and how well your heart's chambers and valves are working. This procedure takes approximately one hour. There are no restrictions for this procedure. Please do NOT wear cologne, perfume, aftershave, or lotions (deodorant is allowed). Please arrive 15 minutes prior to your appointment time.    Follow-Up: At [facility], you and your health needs are our priority.  As part of our continuing mission to provide you with exceptional heart care, we have created designated Provider Care Teams.  These Care Teams include your primary Cardiologist (physician) and Advanced Practice Providers (APPs -  Physician Assistants and Nurse Practitioners) who all work together to provide you with the care you need, when you need it.  We recommend signing up for the patient portal called "MyChart".  Sign up information is provided on this After Visit Summary.  MyChart is used to connect with patients for  Virtual Visits (Telemedicine).  Patients are able to view lab/test results, encounter notes, upcoming appointments, etc.  Non-urgent messages can be sent to your provider as well.   To learn more about what you can do with MyChart, go to [email or web address].    Your next appointment:   1 year(s)--after echo  Provider:   Minus Breeding, MD

## 2022-04-30 NOTE — Addendum Note (Signed)
Addended by: Ernie Hew D on: 04/30/2022 11:55 AM   Modules accepted: Orders

## 2022-05-19 ENCOUNTER — Ambulatory Visit: Payer: Self-pay | Admitting: Surgery

## 2022-05-19 ENCOUNTER — Telehealth: Payer: Self-pay | Admitting: *Deleted

## 2022-05-19 DIAGNOSIS — N138 Other obstructive and reflux uropathy: Secondary | ICD-10-CM | POA: Insufficient documentation

## 2022-05-19 NOTE — H&P (Signed)
Subjective    Chief Complaint: New Patient (Eval hernia right ing pain )   Cardiology - [place]     History of Present Illness: Gregory Hunter is a 62 y.o. male who is seen today as an office consultation at the request of Dr. Kallie Locks for evaluation of New Patient (Eval hernia right ing pain ) .     This is a 62 year old male who presents with several months of right groin discomfort and swelling.  The swelling remains reducible.  He initially noticed this while playing pickle ball.  The patient is fairly active.  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   He presents now to discuss surgical options for repair of this inguinal hernia.   Review of Systems: A complete review of systems was obtained from the patient.  I have reviewed this information and discussed as appropriate with the patient.  See HPI as well for other ROS.   Review of Systems  Constitutional: Negative.   HENT: Negative.    Eyes: Negative.   Respiratory: Negative.    Cardiovascular: Negative.   Gastrointestinal: Negative.   Genitourinary: Negative.   Musculoskeletal: Negative.   Skin: Negative.   Neurological: Negative.   Endo/Heme/Allergies: Negative.   Psychiatric/Behavioral: Negative.          Medical History: Past Medical History      Past Medical History:  Diagnosis Date   Hyperlipidemia     Hypertension             Patient Active Problem List  Diagnosis   Benign prostatic hyperplasia with urinary obstruction   Dilated aortic root (CMS-HCC)   Elevated coronary artery calcium score   Essential hypertension   Hyperlipidemia      Past Surgical History  History reviewed. No pertinent surgical history.      Allergies      Allergies  Allergen Reactions   Lisinopril Itching              Current Outpatient Medications on File Prior to Visit  Medication Sig Dispense Refill   amLODIPine (NORVASC) 10 MG tablet         aspirin 81 MG EC  tablet Take by mouth       cholecalciferol (VITAMIN D3) 2,000 unit capsule Take by mouth       ezetimibe (ZETIA) 10 mg tablet         losartan (COZAAR) 50 MG tablet         pravastatin (PRAVACHOL) 80 MG tablet          No current facility-administered medications on file prior to visit.      Family History       Family History  Problem Relation Age of Onset   High blood pressure (Hypertension) Mother     Hyperlipidemia (Elevated cholesterol) Mother     Skin cancer Father     Hyperlipidemia (Elevated cholesterol) Father     High blood pressure (Hypertension) Father          Social History       Tobacco Use  Smoking Status Never  Smokeless Tobacco Never      Social History  Social History         Socioeconomic History   Marital status: Married  Tobacco Use   Smoking status: Never   Smokeless tobacco: Never  Substance and Sexual Activity   Alcohol use: Yes      Comment: 2 x  daily   Drug use: Never        Objective:         Vitals:    05/19/22 1014  BP: 124/78  Pulse: 94  Temp: 36.6 C (97.8 F)  SpO2: 98%  Weight: 90.6 kg (199 lb 12.8 oz)  Height: 177.8 cm (5\' 10" )    Body mass index is 28.67 kg/m.   Physical Exam    Constitutional:  WDWN in NAD, conversant, no obvious deformities; lying in bed comfortably Eyes:  Pupils equal, round; sclera anicteric; moist conjunctiva; no lid lag HENT:  Oral mucosa moist; good dentition  Neck:  No masses palpated, trachea midline; no thyromegaly Lungs:  CTA bilaterally; normal respiratory effort CV:  Regular rate and rhythm; no murmurs; extremities well-perfused with no edema Abd:  +bowel sounds, soft, non-tender, no palpable organomegaly; no palpable hernias Musc: Normal gait; no apparent clubbing or cyanosis in extremities GU: Bilateral descended testes, no testicular masses.  Visible reducible right inguinal hernia.  No sign of left inguinal hernia. Lymphatic:  No palpable cervical or axillary  lymphadenopathy Skin:  Warm, dry; no sign of jaundice Psychiatric - alert and oriented x 4; calm mood and affect       Assessment and Plan:  Diagnoses and all orders for this visit:   Non-recurrent unilateral inguinal hernia without obstruction or gangrene     We will first obtain cardiac clearance.  Aspirin will need to be held for 5 days prior to surgery   Recommend right inguinal hernia repair with mesh.The surgical procedure has been discussed with the patient.  Potential risks, benefits, alternative treatments, and expected outcomes have been explained.  All of the patient's questions at this time have been answered.  The likelihood of reaching the patient's treatment goal is good.  The patient understand the proposed surgical procedure and wishes to proceed.         Taite Schoeppner Delbert Harness, MD  05/19/2022 2:14 PM

## 2022-05-19 NOTE — Telephone Encounter (Signed)
   Pre-operative Risk Assessment    Patient Name: Gregory Hunter  DOB: 1960-10-06 MRN: [id number]      Request for Surgical Clearance    Procedure:   HERNIA SURGERY  Date of Surgery:  Clearance TBD                                 Surgeon:  Manus Rudd, MD Surgeon's Group or Practice Name:  [facility] Phone number:  [phone] Fax number:  [phone]   Type of Clearance Requested:   - Medical  - Pharmacy:  Hold Aspirin NOT INDICATED HOW LONG   Type of Anesthesia:  General    Additional requests/questions:    [name]   05/19/2022, 12:54 PM

## 2022-05-19 NOTE — Telephone Encounter (Signed)
     Primary Cardiologist: Rollene Rotunda, MD  Chart reviewed as part of pre-operative protocol coverage. Given past medical history and time since last visit, based on ACC/AHA guidelines, Demere Capel would be at acceptable risk for the planned procedure without further cardiovascular testing.   His RCRI is a class II risk, 0.9% risk of major cardiac event.  He is able to complete greater than 4 METS of physical activity.  His aspirin may be held for 5 to 7 days prior to his procedure.  Please resume as soon as hemostasis is achieved.  I will route this recommendation to the requesting party via Epic fax function and remove from pre-op pool.  Please call with questions.  Thomasene Ripple. Noorah Giammona NP-C     05/19/2022, 1:22 PM [facility] [address] Office [phone] Fax [phone]

## 2022-06-24 ENCOUNTER — Ambulatory Visit (HOSPITAL_BASED_OUTPATIENT_CLINIC_OR_DEPARTMENT_OTHER): Admitting: Family Medicine

## 2022-06-24 ENCOUNTER — Encounter (HOSPITAL_BASED_OUTPATIENT_CLINIC_OR_DEPARTMENT_OTHER): Payer: Self-pay | Admitting: Family Medicine

## 2022-06-24 VITALS — BP 132/95 | HR 73 | Temp 97.6°F | Ht 70.0 in | Wt 194.0 lb

## 2022-06-24 DIAGNOSIS — Z1211 Encounter for screening for malignant neoplasm of colon: Secondary | ICD-10-CM

## 2022-06-24 DIAGNOSIS — I10 Essential (primary) hypertension: Secondary | ICD-10-CM

## 2022-06-24 DIAGNOSIS — E785 Hyperlipidemia, unspecified: Secondary | ICD-10-CM | POA: Diagnosis not present

## 2022-06-24 NOTE — Progress Notes (Signed)
New Patient Office Visit  Subjective    Patient ID: Gregory Hunter, male    DOB: October 04, 1960  Age: 62 y.o. MRN: [id number]  CC:  Chief Complaint  Patient presents with   Establish Care    Pt here for establish new care     HPI Ruford Imburgia presents to establish care Last PCP - Dr. Eloise Harman  Patient follows with cardiology related to history of hypertension, hyperlipidemia, CAD.  Current medications include pravastatin, amlodipine, losartan.  Has also been taking ASA 81, although uncertain if this is something he should continue with due to potential risks with taking aspirin.  Denies any issues with statin therapy, no reported myalgias.  Occasionally will take Meloxicam related to various MSK issues - some pain from prior injuries/surgeries.  Does not Isabella Stalling that he is due for colon cancer screening, interested in referral to GI to help with coordinating this.  Patient is originally from [place], [place]. Has lived here since 2017, retired Company secretary - retired in 2012. He enjoys cycling, strength training, pickle ball.  Outpatient Encounter Medications as of 06/24/2022  Medication Sig   amLODipine (NORVASC) 10 MG tablet Take 1 tablet (10 mg total) by mouth daily.   aspirin EC 81 MG tablet Take 1 tablet (81 mg total) by mouth daily. Swallow whole.   Cholecalciferol (VITAMIN D3) 50 MCG (2000 UT) capsule Take 2,000 Units by mouth daily.   ezetimibe (ZETIA) 10 MG tablet Take 1 tablet (10 mg total) by mouth daily.   losartan (COZAAR) 50 MG tablet TAKE 1 TABLET DAILY AS INSTRUCTED BY YOUR PRESCRIBER   pravastatin (PRAVACHOL) 80 MG tablet TAKE 1 TABLET DAILY AS INSTRUCTED BY YOUR PRESCRIBER   [DISCONTINUED] methocarbamol (ROBAXIN) 500 MG tablet Take 1 tablet (500 mg total) by mouth every 6 (six) hours as needed for muscle spasms.   No facility-administered encounter medications on file as of 06/24/2022.    Past Medical History:  Diagnosis Date   Abnormal EKG    Ascending aorta dilatation (HCC)  05/07/2020   42 mm   Complication of anesthesia    Vaso-vagals down after procedures   Elevated troponin    Hypertension    Mild left ventricular hypertrophy 05/07/2020   Mild mitral regurgitation 05/07/2020    Past Surgical History:  Procedure Laterality Date   ANTERIOR CERVICAL DECOMP/DISCECTOMY FUSION N/A 02/28/2021   Procedure: ANTERIOR CERVICAL DECOMPRESSION FUSION CERVICAL 5- CERVICAL 6, CERVICAL 6- CERVICAL 7 WITH INSTRUMENTATION AND ALLOGRAFT;  Surgeon: Estill Bamberg, MD;  Location[facility];  Service: Orthopedics;  Laterality: N/A;   back fusion  2007   KNEE CARTILAGE SURGERY Bilateral    1978   SHOULDER SURGERY Right 2021    History reviewed. No pertinent family history.  Social History   Socioeconomic History   Marital status: Married    Spouse name: Not on file   Number of children: 3   Years of education: Not on file   Highest education level: Not on file  Occupational History   Occupation: Chemical engineer  Tobacco Use   Smoking status: Former    Types: Cigars   Smokeless tobacco: Never  Vaping Use   Vaping Use: Never used  Substance and Sexual Activity   Alcohol use: Yes    Alcohol/week: 14.0 standard drinks of alcohol    Types: 14 Standard drinks or equivalent per week   Drug use: No   Sexual activity: Not on file  Other Topics Concern   Not on file  Social History Narrative  Lives with wife.     Social Determinants of Health   Financial Resource Strain: Not on file  Food Insecurity: Not on file  Transportation Needs: Not on file  Physical Activity: Not on file  Stress: Not on file  Social Connections: Not on file  Intimate Partner Violence: Not on file    Objective    BP (!) 132/95 (BP Location: Right Arm, Patient Position: Sitting, Cuff Size: Normal)   Pulse 73   Temp 97.6 F (36.4 C) (Oral)   Ht 5\' 10"  (1.778 m)   Wt 194 lb (88 kg)   SpO2 98%   BMI 27.84 kg/m   Physical Exam  62 year old male in no acute  distress Cardiovascular exam regular rate and rhythm Lungs clear to auscultation bilaterally  Assessment & Plan:   Problem List Items Addressed This Visit       Cardiovascular and Mediastinum   Essential hypertension - Primary    Blood pressure borderline in office today, can continue with current medication regimen Recommend intermittent monitoring of blood pressure at home, DASH diet        Other   Hyperlipidemia    Tolerating statin therapy currently.  Lipid panel earlier this year did show adequate control.  No changes to medication regimen today      Other Visit Diagnoses     Colon cancer screening       Relevant Orders   Ambulatory referral to Gastroenterology       Return in about 6 weeks (around 08/05/2022) for CPE.   Loyce Klasen J [place], MD

## 2022-06-27 NOTE — Assessment & Plan Note (Signed)
Tolerating statin therapy currently.  Lipid panel earlier this year did show adequate control.  No changes to medication regimen today

## 2022-06-27 NOTE — Assessment & Plan Note (Signed)
Blood pressure borderline in office today, can continue with current medication regimen Recommend intermittent monitoring of blood pressure at home, DASH diet

## 2022-09-02 ENCOUNTER — Ambulatory Visit (INDEPENDENT_AMBULATORY_CARE_PROVIDER_SITE_OTHER): Admitting: Family Medicine

## 2022-09-02 ENCOUNTER — Encounter (HOSPITAL_BASED_OUTPATIENT_CLINIC_OR_DEPARTMENT_OTHER): Payer: Self-pay | Admitting: Family Medicine

## 2022-09-02 VITALS — BP 116/85 | HR 75 | Ht 70.0 in | Wt 200.0 lb

## 2022-09-02 DIAGNOSIS — Z23 Encounter for immunization: Secondary | ICD-10-CM | POA: Diagnosis not present

## 2022-09-02 DIAGNOSIS — Z0000 Encounter for general adult medical examination without abnormal findings: Secondary | ICD-10-CM | POA: Diagnosis not present

## 2022-09-02 DIAGNOSIS — H6121 Impacted cerumen, right ear: Secondary | ICD-10-CM | POA: Insufficient documentation

## 2022-09-02 LAB — NMR, LIPOPROFILE
Cholesterol, Total: 141 mg/dL (ref 100–199)
HDL Particle Number: 40.8 umol/L (ref >=30.5)
HDL-C: 69 mg/dL (ref >39)
LDL Particle Number: 800 nmol/L (ref <1000)
LDL Size: 19.8 nm — ABNORMAL LOW (ref >20.5)
LDL-C (NIH Calc): 58 mg/dL (ref 0–99)
LP-IR Score: 27 (ref <=45)
Small LDL Particle Number: 573 nmol/L — ABNORMAL HIGH (ref <=527)
Triglycerides: 70 mg/dL (ref 0–149)

## 2022-09-02 NOTE — Assessment & Plan Note (Signed)
Noted on exam, he does report being told about notable cerumen when having hearing testing done at hospital.  Recommend proceeding with irrigation today, patient amenable, right ear lavage completed in office with notable improvement and symptoms

## 2022-09-02 NOTE — Addendum Note (Signed)
Addended by: Hendricks Milo on: 09/02/2022 03:12 PM   Modules accepted: Orders

## 2022-09-02 NOTE — Assessment & Plan Note (Signed)
Routine HCM labs reviewed. HCM reviewed/discussed. Anticipatory guidance regarding healthy weight, lifestyle and choices given. Recommend healthy diet.  Recommend approximately 150 minutes/week of moderate intensity exercise Recommend regular dental and vision exams Always use seatbelt/lap and shoulder restraints Recommend using smoke alarms and checking batteries at least twice a year Recommend using sunscreen when outside Discussed colon cancer screening recommendations, options.  Referral placed previously Discussed recommendations for shingles vaccine.  Patient will consider Discussed tetanus immunization recommendations, patient agreed to proceed with this today

## 2022-09-02 NOTE — Progress Notes (Signed)
Subjective:    CC: Annual Physical Exam  HPI:  Gregory Hunter is a 62 y.o. presenting for annual physical  I reviewed the past medical history, family history, social history, surgical history, and allergies today and no changes were needed.  Please see the problem list section below in epic for further details.  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   42 mm   Complication of anesthesia    Vaso-vagals down after procedures   Elevated troponin    Hypertension    Mild left ventricular hypertrophy 05/07/2020   Mild mitral regurgitation 05/07/2020   Past Surgical History: Past Surgical History:  Procedure Laterality Date   ANTERIOR CERVICAL DECOMP/DISCECTOMY FUSION N/A 02/28/2021   Procedure: ANTERIOR CERVICAL DECOMPRESSION FUSION CERVICAL 5- CERVICAL 6, CERVICAL 6- CERVICAL 7 WITH INSTRUMENTATION AND ALLOGRAFT;  Surgeon: Estill Bamberg, MD;  Location: [facility] OR;  Service: Orthopedics;  Laterality: N/A;   back fusion  2007   KNEE CARTILAGE SURGERY Bilateral    1978   SHOULDER SURGERY Right 2021   Social History: Social History   Socioeconomic History   Marital status: Married    Spouse name: Not on file   Number of children: 3   Years of education: Not on file   Highest education level: Not on file  Occupational History   Occupation: Chemical engineer  Tobacco Use   Smoking status: Former    Types: Cigars   Smokeless tobacco: Never  Vaping Use   Vaping status: Never Used  Substance and Sexual Activity   Alcohol use: Yes    Alcohol/week: 14.0 standard drinks of alcohol    Types: 14 Standard drinks or equivalent per week   Drug use: No   Sexual activity: Not on file  Other Topics Concern   Not on file  Social History Narrative   Lives with wife.     Social Determinants of Health   Financial Resource Strain: Not on file  Food Insecurity: Not on file  Transportation Needs: Not on file   Physical Activity: Not on file  Stress: Not on file  Social Connections: Not on file   Family History: History reviewed. No pertinent family history. Allergies: Allergies  Allergen Reactions   Lisinopril Itching   Medications: See med rec.  Review of Systems: No headache, visual changes, nausea, vomiting, diarrhea, constipation, dizziness, abdominal pain, skin rash, fevers, chills, night sweats, swollen lymph nodes, weight loss, chest pain, body aches, joint swelling, muscle aches, shortness of breath, mood changes, visual or auditory hallucinations.  Objective:    BP 116/85 (BP Location: Right Arm, Patient Position: Sitting, Cuff Size: Normal)   Pulse 75   Ht 5\' 10"  (1.778 m)   Wt 200 lb (90.7 kg)   SpO2 97%   BMI 28.70 kg/m   General: Well Developed, well nourished, and in no acute distress. Neuro: Alert and oriented x3, extra-ocular muscles intact, sensation grossly intact. Cranial nerves II through XII are intact, motor, sensory, and coordinative functions are all intact. HEENT: Normocephalic, atraumatic, pupils equal round reactive to light, neck supple, no masses, no lymphadenopathy, thyroid nonpalpable. Oropharynx, nasopharynx, external ear canals are unremarkable. Skin: Warm and dry, no rashes noted. Cardiac: Regular rate and rhythm, no murmurs rubs or gallops. Respiratory: Clear to auscultation bilaterally. Not using accessory muscles, speaking in full sentences. Abdominal: Soft, nontender, nondistended, positive bowel sounds, no masses, no organomegaly. Musculoskeletal: Shoulder, elbow, wrist, hip, knee, ankle stable,  and with full range of motion.  Impression and Recommendations:    Wellness examination Assessment & Plan: Routine HCM labs reviewed. HCM reviewed/discussed. Anticipatory guidance regarding healthy weight, lifestyle and choices given. Recommend healthy diet.  Recommend approximately 150 minutes/week of moderate intensity exercise Recommend regular  dental and vision exams Always use seatbelt/lap and shoulder restraints Recommend using smoke alarms and checking batteries at least twice a year Recommend using sunscreen when outside Discussed colon cancer screening recommendations, options.  Referral placed previously Discussed recommendations for shingles vaccine.  Patient will consider Discussed tetanus immunization recommendations, patient agreed to proceed with this today   Need for tetanus booster  Impacted cerumen of right ear Assessment & Plan: Noted on exam, he does report being told about notable cerumen when having hearing testing done at hospital.  Recommend proceeding with irrigation today, patient amenable, right ear lavage completed in office with notable improvement and symptoms   Return in about 1 year (around 09/02/2023) for CPE.  Does have some orthopedic concerns - history of right shoulder surgery, medial sided right elbow pain, small nodule in right hand near base of 4th and 5th fingers.  Surgery was about 3 years ago, recommend following up with orthopedic surgeon to determine if current symptoms are related to prior similar issues for which patient had surgery, additional consideration is further evaluation physical therapy to determine if current home exercise program is most appropriate or if adjustments would be recommended.  Medial sided right elbow pain is suggestive of golfers elbow, reviewed condition, handout provided.  Could consider referral to orthopedic hand specialist for current concern, patient declines at this time and prefers to monitor.  Advised that he let us know if he would like referral placed.   ___________________________________________ Jeanni Allshouse de [place], MD, ABFM, [facility]

## 2022-09-15 ENCOUNTER — Encounter: Payer: Self-pay | Admitting: Internal Medicine

## 2022-09-24 ENCOUNTER — Telehealth (HOSPITAL_BASED_OUTPATIENT_CLINIC_OR_DEPARTMENT_OTHER): Payer: Self-pay | Admitting: Family Medicine

## 2022-09-24 DIAGNOSIS — Z125 Encounter for screening for malignant neoplasm of prostate: Secondary | ICD-10-CM

## 2022-09-24 NOTE — Telephone Encounter (Signed)
I followed up with this patient after his visit for his physical. He was concerned that he did not have his A1C and glucose labs done with past history.  We can do these in office in our lab. Also he was concerned we did not do the PSA as well. Are you comfortable ordering PSA labs for this patient? We can schedule him here for the A1C and glucose.

## 2022-09-26 ENCOUNTER — Encounter (HOSPITAL_BASED_OUTPATIENT_CLINIC_OR_DEPARTMENT_OTHER): Payer: Self-pay

## 2022-10-01 ENCOUNTER — Ambulatory Visit (AMBULATORY_SURGERY_CENTER)

## 2022-10-01 VITALS — Ht 70.0 in | Wt 195.0 lb

## 2022-10-01 DIAGNOSIS — Z1211 Encounter for screening for malignant neoplasm of colon: Secondary | ICD-10-CM

## 2022-10-01 NOTE — Progress Notes (Signed)
Pre visit completed via phone call; Patient verified name, DOB, and address;  No egg or soy allergy known to patient;  No issues known to pt with past sedation with any surgeries or procedures---other than PONV, vasovagal syncope after procedures per pt; Patient denies ever being told they had issues or difficulty with intubation;  No FH of Malignant Hyperthermia; Pt is not on diet pills; Pt is not on home 02;  Pt is not on blood thinners;  Pt denies issues with constipation;  No A fib or A flutter; Have any cardiac testing pending--NO Insurance verified during PV appt--- [employer] Pt can ambulate without assistance;  Pt denies use of chewing tobacco; Discussed diabetic/weight loss medication holds; Discussed NSAID holds; Checked BMI to be less than 50; Pt instructed to use Singlecare.com or GoodRx for a price reduction on prep;  Patient's chart reviewed by Cathlyn Parsons CNRA prior to previsit and patient appropriate for the [facility].  Pre visit completed and red dot placed by patient's name on their procedure day (on provider's schedule).   Instructions sent to patient via MyChart per his request;

## 2022-10-15 ENCOUNTER — Other Ambulatory Visit: Payer: Self-pay | Admitting: Cardiology

## 2022-10-16 ENCOUNTER — Other Ambulatory Visit (HOSPITAL_BASED_OUTPATIENT_CLINIC_OR_DEPARTMENT_OTHER): Payer: Self-pay | Admitting: Family Medicine

## 2022-10-17 LAB — PSA TOTAL (REFLEX TO FREE): Prostate Specific Ag, Serum: 2.6 ng/mL (ref 0.0–4.0)

## 2022-10-30 ENCOUNTER — Encounter: Payer: Self-pay | Admitting: Internal Medicine

## 2022-10-30 ENCOUNTER — Ambulatory Visit (AMBULATORY_SURGERY_CENTER): Admitting: Internal Medicine

## 2022-10-30 VITALS — BP 104/69 | HR 68 | Temp 98.6°F | Resp 14 | Ht 70.0 in | Wt 195.0 lb

## 2022-10-30 DIAGNOSIS — Z1211 Encounter for screening for malignant neoplasm of colon: Secondary | ICD-10-CM

## 2022-10-30 MED ORDER — SODIUM CHLORIDE 0.9 % IV SOLN
500.0000 mL | INTRAVENOUS | Status: DC
Start: 2022-10-30 — End: 2022-10-30

## 2022-10-30 NOTE — Progress Notes (Signed)
[facility] History and Physical   Primary Care Physician:  de [place], Buren Kos, MD   Reason for Procedure:    Encounter Diagnosis  Name Primary?   Colon cancer screening Yes     Plan:    colonoscopy     HPI: Gregory Hunter is a 62 y.o. 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   42 mm   Complication of anesthesia    Vaso-vagals down after procedures   Elevated troponin    Hyperlipidemia    on meds   Hypertension    on meds   Mild left ventricular hypertrophy 05/07/2020   Mild mitral regurgitation 05/07/2020    Past Surgical History:  Procedure Laterality Date   ANTERIOR CERVICAL DECOMP/DISCECTOMY FUSION N/A 02/28/2021   Procedure: ANTERIOR CERVICAL DECOMPRESSION FUSION CERVICAL 5- CERVICAL 6, CERVICAL 6- CERVICAL 7 WITH INSTRUMENTATION AND ALLOGRAFT;  Surgeon: Estill Bamberg, MD;  Location: [facility] OR;  Service: Orthopedics;  Laterality: N/A;   back fusion  2007   INGUINAL HERNIA REPAIR Right 2024   KNEE CARTILAGE SURGERY Bilateral    1978   SHOULDER SURGERY Right 2021   WISDOM TOOTH EXTRACTION      Prior to Admission medications   Medication Sig Start Date End Date Taking? Authorizing Provider  amLODipine (NORVASC) 10 MG tablet Take 1 tablet (10 mg total) by mouth daily. 04/30/22  Yes Rollene Rotunda, MD  aspirin EC 81 MG tablet Take 1 tablet (81 mg total) by mouth daily. Swallow whole. 06/03/21  Yes Rollene Rotunda, MD  Cholecalciferol (VITAMIN D3) 50 MCG (2000 UT) capsule Take 2,000 Units by mouth daily.   Yes [provider]  ezetimibe (ZETIA) 10 MG tablet Take 1 tablet (10 mg total) by mouth daily. 05/24/18  Yes Rollene Rotunda, MD  losartan (COZAAR) 50 MG tablet TAKE 1 TABLET DAILY AS INSTRUCTED BY YOUR PRESCRIBER 10/15/22  Yes Rollene Rotunda, MD  pravastatin (PRAVACHOL) 80 MG tablet TAKE 1 TABLET DAILY AS INSTRUCTED BY YOUR PRESCRIBER 10/15/22  Yes Rollene Rotunda, MD    Current Outpatient Medications  Medication Sig Dispense Refill   amLODipine (NORVASC) 10 MG tablet Take 1 tablet (10 mg total) by mouth daily. 90 tablet 3   aspirin EC 81 MG tablet Take 1 tablet (81 mg total) by mouth daily. Swallow whole. 30 tablet 11   Cholecalciferol (VITAMIN D3) 50 MCG (2000 UT) capsule Take 2,000 Units by mouth daily.     ezetimibe (ZETIA) 10 MG tablet Take 1 tablet (10 mg total) by mouth daily. 90 tablet 3   losartan (COZAAR) 50 MG tablet TAKE 1 TABLET DAILY AS INSTRUCTED BY YOUR PRESCRIBER 90 tablet 3   pravastatin (PRAVACHOL) 80 MG tablet TAKE 1 TABLET DAILY AS INSTRUCTED BY YOUR PRESCRIBER 90 tablet 3   Current Facility-Administered Medications  Medication Dose Route Frequency Provider Last Rate Last Admin   0.9 %  sodium chloride infusion  500 mL Intravenous Continuous Iva Boop, MD        Allergies as of 10/30/2022 - Review Complete 10/30/2022  Allergen Reaction Noted   Lisinopril Itching 02/19/2021    Family History  Problem Relation Age of Onset   Stomach cancer Neg Hx    Rectal cancer Neg Hx    Esophageal cancer Neg Hx    Colon cancer Neg Hx    Colon polyps Neg Hx     Social History   Socioeconomic  History   Marital status: Married    Spouse name: Not on file   Number of children: 3   Years of education: Not on file   Highest education level: Not on file  Occupational History   Occupation: Chemical engineer  Tobacco Use   Smoking status: Former    Types: Cigars   Smokeless tobacco: Never  Vaping Use   Vaping status: Never Used  Substance and Sexual Activity   Alcohol use: Yes    Alcohol/week: 14.0 standard drinks of alcohol    Types: 14 Standard drinks or equivalent per week   Drug use: No   Sexual activity: Not on file  Other Topics Concern   Not on file  Social History Narrative   Lives with wife.     Social Determinants of Health   Financial Resource Strain: Not on file  Food Insecurity: Not on file   Transportation Needs: Not on file  Physical Activity: Not on file  Stress: Not on file  Social Connections: Not on file  Intimate Partner Violence: Not on file    Review of Systems:  All other review of systems negative except as mentioned in the HPI.  Physical Exam: Vital signs BP 116/72   Pulse 66   Temp 98.6 F (37 C)   Ht 5\' 10"  (1.778 m)   Wt 195 lb (88.5 kg)   SpO2 98%   BMI 27.98 kg/m   General:   Alert,  Well-developed, well-nourished, pleasant and cooperative in NAD Lungs:  Clear throughout to auscultation.   Heart:  Regular rate and rhythm; no murmurs, clicks, rubs,  or gallops. Abdomen:  Soft, nontender and nondistended. Normal bowel sounds.   Neuro/Psych:  Alert and cooperative. Normal mood and affect. A and O x 3   @Gregory Hunter  Sena Slate, MD, [facility] [phone] (pager) 10/30/2022 11:50 AM@

## 2022-10-30 NOTE — Progress Notes (Signed)
Pt's states no medical or surgical changes since previsit or office visit. 

## 2022-10-30 NOTE — Progress Notes (Signed)
Vss nad trans to pacu 

## 2022-10-30 NOTE — Patient Instructions (Addendum)
No polyps or cancer seen.  You do have diverticulosis - thickened muscle rings and pouches in the colon wall. Please read the handout about this condition.   Next routine colonoscopy or other screening test in 10 years - 2034.  I appreciate the opportunity to care for you. Iva Boop, MD, [facility]  Handout provided on diverticulosis.  Resume previous diet.  Continue present medications.  Repeat colonoscopy in 10 years for screening purposes.   YOU HAD AN ENDOSCOPIC PROCEDURE TODAY AT THE [facility]:   Refer to the procedure report that was given to you for any specific questions about what was found during the examination.  If the procedure report does not answer your questions, please call your gastroenterologist to clarify.  If you requested that your care partner not be given the details of your procedure findings, then the procedure report has been included in a sealed envelope for you to review at your convenience later.  YOU SHOULD EXPECT: Some feelings of bloating in the abdomen. Passage of more gas than usual.  Walking can help get rid of the air that was put into your GI tract during the procedure and reduce the bloating. If you had a lower endoscopy (such as a colonoscopy or flexible sigmoidoscopy) you may notice spotting of blood in your stool or on the toilet paper. If you underwent a bowel prep for your procedure, you may not have a normal bowel movement for a few days.  Please Note:  You might notice some irritation and congestion in your nose or some drainage.  This is from the oxygen used during your procedure.  There is no need for concern and it should clear up in a day or so.  SYMPTOMS TO REPORT IMMEDIATELY:  Following lower endoscopy (colonoscopy or flexible sigmoidoscopy):  Excessive amounts of blood in the stool  Significant tenderness or worsening of abdominal pains  Swelling of the abdomen that is new, acute  Fever of 100F or higher  For urgent or  emergent issues, a gastroenterologist can be reached at any hour by calling [phone]. Do not use MyChart messaging for urgent concerns.    DIET:  We do recommend a small meal at first, but then you may proceed to your regular diet.  Drink plenty of fluids but you should avoid alcoholic beverages for 24 hours.  ACTIVITY:  You should plan to take it easy for the rest of today and you should NOT DRIVE or use heavy machinery until tomorrow (because of the sedation medicines used during the test).    FOLLOW UP: Our staff will call the number listed on your records the next business day following your procedure.  We will call around 7:15- 8:00 am to check on you and address any questions or concerns that you may have regarding the information given to you following your procedure. If we do not reach you, we will leave a message.     If any biopsies were taken you will be contacted by phone or by letter within the next 1-3 weeks.  Please call us at [phone] if you have not heard about the biopsies in 3 weeks.    SIGNATURES/CONFIDENTIALITY: You and/or your care partner have signed paperwork which will be entered into your electronic medical record.  These signatures attest to the fact that that the information above on your After Visit Summary has been reviewed and is understood.  Full responsibility of the confidentiality of this discharge information lies with  you and/or your care-partner.

## 2022-10-30 NOTE — Op Note (Signed)
[facility] Patient Name: Gregory Hunter Procedure Date: 10/30/2022 11:41 AM MRN: [id number] Endoscopist: Iva Boop , MD, [id number] Age: 62 Referring MD:  Date of Birth: 09-23-1960 Gender: Male Account #: [id number] Procedure:                Colonoscopy Indications:              Screening for colorectal malignant neoplasm Medicines:                Monitored Anesthesia Care Procedure:                Pre-Anesthesia Assessment:                           - Prior to the procedure, a History and Physical                            was performed, and patient medications and                            allergies were reviewed. The patient's tolerance of                            previous anesthesia was also reviewed. The risks                            and benefits of the procedure and the sedation                            options and risks were discussed with the patient.                            All questions were answered, and informed consent                            was obtained. Prior Anticoagulants: The patient has                            taken no anticoagulant or antiplatelet agents. ASA                            Grade Assessment: II - A patient with mild systemic                            disease. After reviewing the risks and benefits,                            the patient was deemed in satisfactory condition to                            undergo the procedure.                           After obtaining informed consent, the colonoscope  was passed under direct vision. Throughout the                            procedure, the patient's blood pressure, pulse, and                            oxygen saturations were monitored continuously. The                            CF [id number] #[id number] was introduced through the anus                            and advanced to the the cecum, identified by                            appendiceal orifice and  ileocecal valve. The                            colonoscopy was performed without difficulty. The                            patient tolerated the procedure well. The quality                            of the bowel preparation was good. The ileocecal                            valve, appendiceal orifice, and rectum were                            photographed. Scope In: 11:58:44 AM Scope Out: 12:11:13 PM Scope Withdrawal Time: 0 hours 9 minutes 36 seconds  Total Procedure Duration: 0 hours 12 minutes 29 seconds  Findings:                 The perianal and digital rectal examinations were                            normal. Pertinent negatives include normal prostate                            (size, shape, and consistency).                           Multiple diverticula were found in the sigmoid                            colon.                           The exam was otherwise without abnormality on                            direct and retroflexion views. Complications:            No immediate complications. Estimated Blood Loss:  Estimated blood loss: none. Impression:               - Diverticulosis in the sigmoid colon.                           - The examination was otherwise normal on direct                            and retroflexion views.                           - No specimens collected. Recommendation:           - Patient has a contact number available for                            emergencies. The signs and symptoms of potential                            delayed complications were discussed with the                            patient. Return to normal activities tomorrow.                            Written discharge instructions were provided to the                            patient.                           - Resume previous diet.                           - Continue present medications.                           - Repeat colonoscopy in 10 years for screening                             purposes. Iva Boop, MD 10/30/2022 12:16:37 PM This report has been signed electronically.

## 2022-11-03 ENCOUNTER — Telehealth: Payer: Self-pay | Admitting: *Deleted

## 2022-11-03 NOTE — Telephone Encounter (Signed)
  Follow up Call-     10/30/2022   11:00 AM  Call back number  Post procedure Call Back phone  # [phone]  Permission to leave phone message Yes     Patient questions:  Do you have a fever, pain , or abdominal swelling? No. Pain Score  0 *  Have you tolerated food without any problems? Yes.    Have you been able to return to your normal activities? Yes.    Do you have any questions about your discharge instructions: Diet   No. Medications  No. Follow up visit  No.  Do you have questions or concerns about your Care? No.  Actions: * If pain score is 4 or above: No action needed, pain <4.

## 2023-01-07 ENCOUNTER — Telehealth: Payer: Self-pay | Admitting: Cardiology

## 2023-01-07 DIAGNOSIS — I77810 Thoracic aortic ectasia: Secondary | ICD-10-CM

## 2023-01-07 NOTE — Telephone Encounter (Signed)
Per patient schedule message, patient states he normally has aortic testing done prior to seeing Dr. Antoine Poche for his annual. There are no orders in the system currently. Please confirm whether or not this is needed prior to 05/01/23 appointment.

## 2023-01-14 ENCOUNTER — Telehealth: Payer: Self-pay | Admitting: Cardiology

## 2023-01-14 NOTE — Telephone Encounter (Signed)
Attempted to call patient, no answer left message requesting a call back.    Front office: If patient returns call please inform him that order for aortic CT placed

## 2023-01-14 NOTE — Telephone Encounter (Signed)
Rollene Rotunda, MD  You7 days ago   Yes.  He needs a CT ascending aorta.

## 2023-01-15 NOTE — Telephone Encounter (Signed)
 2nd attempt to call patient, no answer left message requesting a call back.

## 2023-01-16 NOTE — Telephone Encounter (Signed)
Attempted to call patient, declined confirming name and date of birth. Patient disconnected call.

## 2023-04-15 ENCOUNTER — Ambulatory Visit (HOSPITAL_COMMUNITY): Attending: Cardiology

## 2023-04-15 ENCOUNTER — Encounter (INDEPENDENT_AMBULATORY_CARE_PROVIDER_SITE_OTHER): Payer: Self-pay

## 2023-04-15 DIAGNOSIS — I10 Essential (primary) hypertension: Secondary | ICD-10-CM | POA: Insufficient documentation

## 2023-04-15 DIAGNOSIS — E785 Hyperlipidemia, unspecified: Secondary | ICD-10-CM | POA: Insufficient documentation

## 2023-04-15 DIAGNOSIS — I77810 Thoracic aortic ectasia: Secondary | ICD-10-CM | POA: Insufficient documentation

## 2023-04-15 DIAGNOSIS — I340 Nonrheumatic mitral (valve) insufficiency: Secondary | ICD-10-CM | POA: Insufficient documentation

## 2023-04-15 DIAGNOSIS — R931 Abnormal findings on diagnostic imaging of heart and coronary circulation: Secondary | ICD-10-CM | POA: Diagnosis present

## 2023-04-15 LAB — ECHOCARDIOGRAM COMPLETE
Area-P 1/2: 7.29 cm2
MV M vel: 4.99 m/s
MV Peak grad: 99.6 mmHg
Radius: 0.8 cm
S' Lateral: 2.7 cm

## 2023-04-16 ENCOUNTER — Encounter: Payer: Self-pay | Admitting: Cardiology

## 2023-04-29 NOTE — Progress Notes (Unsigned)
 Cardiology Office Note:   Date:  05/01/2023  ID:  Gregory Hunter, DOB Jan 21, 1961, MRN [id number] PCP: de Peru, Raymond J, MD  [facility] Providers Cardiologist:  Rollene Rotunda, MD {  History of Present Illness:   Gregory Hunter is a 63 y.o. male who presents for follow up of an abnormal EKG. He has had this noted when he was in the [employer]. At some point along the way he did have a cardiac catheterization which he says was normal.  He did have a treadmill perfusion study in December 2008 demonstrated normal perfusion is a normal EF. Echocardiogram showed mild LVH that time. He had a negative POET (Plain Old Exercise Treadmill) in 2019. Echocardiogram has demonstrated some mild mitral regurgitation in 2012.   I sent him for a coronary calcium that was 140 and at the 82nd%.  He also has slight aortic enlargement.      Perfusion study was negative for ischemia or infarct.  This study was done in January because he had an episode of chest pain.     Since he was seen he has felt well.  He did have to inguinal hernia surgery and this stopped in for a little bit.  He is not been doing as much aerobic activity between that winter.  He goes to the gym and he does some weights.  With his physical activity he denies any cardiovascular symptoms. The patient denies any new symptoms such as chest discomfort, neck or arm discomfort. There has been no new shortness of breath, PND or orthopnea. There have been no reported palpitations, presyncope or syncope.   He did have follow-up echo for MR in March of this year and his EF is 65 to 70%.  The images were somewhat suboptimal but looks like moderate mitral valve regurgitation.   ROS: As stated in the HPI and negative for all other systems.   Studies Reviewed:    EKG:   EKG Interpretation Date/Time:  Friday May 01 2023 09:42:01 EDT Ventricular Rate:  67 PR Interval:  238 QRS Duration:  88 QT Interval:  400 QTC Calculation: 422 R Axis:   -15  Text  Interpretation: Sinus rhythm with 1st degree A-V block T wave abnormality, consider lateral ischemia No significant change since last tracing Confirmed by Rollene Rotunda ([id number]) on 05/01/2023 10:37:50 AM    Risk Assessment/Calculations:              Physical Exam:   VS:  BP 130/88 (BP Location: Left Arm, Patient Position: Sitting, Cuff Size: Normal)   Pulse 67   Ht 5\' 10"  (1.778 m)   Wt 204 lb 3.2 oz (92.6 kg)   SpO2 98%   BMI 29.30 kg/m    Wt Readings from Last 3 Encounters:  05/01/23 204 lb 3.2 oz (92.6 kg)  10/30/22 195 lb (88.5 kg)  10/01/22 195 lb (88.5 kg)     GEN: Well nourished, well developed in no acute distress NECK: No JVD; No carotid bruits CARDIAC: RRR, no murmurs, rubs, gallops RESPIRATORY:  Clear to auscultation without rales, wheezing or rhonchi  ABDOMEN: Soft, non-tender, non-distended EXTREMITIES:  No edema; No deformity   ASSESSMENT AND PLAN:   Elevated coronary calcium:    Stress test was negative.  Has had no new symptoms.  We talked again about aggressive risk reduction.  Hypertension: The blood pressure is at target.  No change in therapy.   Hyperlipidemia:   LDL was 58 with an HDL of 69.  No change  in therapy.    MR:   This was moderate and I will follow-up with an echo in March of next year.   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  I will follow-up with a CT in February 2026.  Follow up with me in 1 year  Signed, Rollene Rotunda, MD

## 2023-05-01 ENCOUNTER — Encounter: Payer: Self-pay | Admitting: Cardiology

## 2023-05-01 ENCOUNTER — Ambulatory Visit: Attending: Cardiology | Admitting: Cardiology

## 2023-05-01 VITALS — BP 130/88 | HR 67 | Ht 70.0 in | Wt 204.2 lb

## 2023-05-01 DIAGNOSIS — I7789 Other specified disorders of arteries and arterioles: Secondary | ICD-10-CM | POA: Diagnosis not present

## 2023-05-01 DIAGNOSIS — I340 Nonrheumatic mitral (valve) insufficiency: Secondary | ICD-10-CM | POA: Diagnosis not present

## 2023-05-01 DIAGNOSIS — R931 Abnormal findings on diagnostic imaging of heart and coronary circulation: Secondary | ICD-10-CM

## 2023-05-01 DIAGNOSIS — I10 Essential (primary) hypertension: Secondary | ICD-10-CM | POA: Diagnosis not present

## 2023-05-01 NOTE — Patient Instructions (Signed)
 Medication Instructions:  No changes.  *If you need a refill on your cardiac medications before your next appointment, please call your pharmacy*    Testing/Procedures:  Non-Cardiac CT Angiography (CTA), is a special type of CT scan that uses a computer to produce multi-dimensional views of major blood vessels throughout the body. In CT angiography, a contrast material is injected through an IV to help visualize the blood vessels Due in February 2026.  Your physician has requested that you have an echocardiogram.Due in February 2026. Echocardiography is a painless test that uses sound waves to create images of your heart. It provides your doctor with information about the size and shape of your heart and how well your heart's chambers and valves are working. This procedure takes approximately one hour. There are no restrictions for this procedure. Please do NOT wear cologne, perfume, aftershave, or lotions (deodorant is allowed). Please arrive 15 minutes prior to your appointment time.  Please note: We ask at that you not bring children with you during ultrasound (echo/ vascular) testing. Due to room size and safety concerns, children are not allowed in the ultrasound rooms during exams. Our front office staff cannot provide observation of children in our lobby area while testing is being conducted. An adult accompanying a patient to their appointment will only be allowed in the ultrasound room at the discretion of the ultrasound technician under special circumstances. We apologize for any inconvenience.    Follow-Up: At [facility], you and your health needs are our priority.  As part of our continuing mission to provide you with exceptional heart care, we have created designated Provider Care Teams.  These Care Teams include your primary Cardiologist (physician) and Advanced Practice Providers (APPs -  Physician Assistants and Nurse Practitioners) who all work together to provide you  with the care you need, when you need it.  We recommend signing up for the patient portal called "MyChart".  Sign up information is provided on this After Visit Summary.  MyChart is used to connect with patients for Virtual Visits (Telemedicine).  Patients are able to view lab/test results, encounter notes, upcoming appointments, etc.  Non-urgent messages can be sent to your provider as well.   To learn more about what you can do with MyChart, go to [email or web address].    Your next appointment:   1 year(s)  Provider:   Rollene Rotunda, MD     Other Instructions

## 2023-05-14 ENCOUNTER — Telehealth: Payer: Self-pay | Admitting: Cardiology

## 2023-05-14 NOTE — Telephone Encounter (Signed)
 Paper Work Dropped Off: papers regarding FAA 2 med cert   Date: [id number]  Location of paper:  Dr. Antoine Poche box

## 2023-05-27 ENCOUNTER — Encounter (HOSPITAL_BASED_OUTPATIENT_CLINIC_OR_DEPARTMENT_OTHER): Payer: Self-pay | Admitting: Family Medicine

## 2023-06-22 ENCOUNTER — Other Ambulatory Visit: Payer: Self-pay | Admitting: Cardiology

## 2023-07-12 HISTORY — PX: KNEE SURGERY: SHX244

## 2023-09-03 ENCOUNTER — Encounter (HOSPITAL_BASED_OUTPATIENT_CLINIC_OR_DEPARTMENT_OTHER): Admitting: Family Medicine

## 2023-09-07 ENCOUNTER — Encounter: Payer: Self-pay | Admitting: Diagnostic Neuroimaging

## 2023-09-07 ENCOUNTER — Ambulatory Visit (INDEPENDENT_AMBULATORY_CARE_PROVIDER_SITE_OTHER): Admitting: Diagnostic Neuroimaging

## 2023-09-07 VITALS — BP 129/87 | HR 70 | Ht 70.0 in | Wt 202.0 lb

## 2023-09-07 DIAGNOSIS — H539 Unspecified visual disturbance: Secondary | ICD-10-CM

## 2023-09-07 DIAGNOSIS — G43109 Migraine with aura, not intractable, without status migrainosus: Secondary | ICD-10-CM

## 2023-09-07 NOTE — Progress Notes (Unsigned)
 [facility]  PATIENT: Gregory Hunter DOB: [birth year]  REFERRING CLINICIAN: de Peru, Raymond J, MD HISTORY FROM: patient  REASON FOR VISIT: new consult   HISTORICAL  CHIEF COMPLAINT:  Chief Complaint  Patient presents with   Migraine    RM 7 alone  Pt is well, reports he has been having ocular migraines since 2006. He has not had a migraine in the last year.  Associated disturbed vision, no other symptoms or head pain.  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  History of hypertension hyperlipidemia.  Patient is retired from [employer], serving from [place] until 2012.  Around 2005 he developed transient visual disturbance consisting of seeing jagged lines, a small distortion spot in the center of his visual fields bilaterally, with a C shape distortion that slightly grows over time.  Symptoms last about 15 to 20 minutes at a time and then resolved.  No other associated symptoms.  No nausea or vomiting, sensitivity light or sound or headaches.  These are happening about 2-4 times per month.  Eventually he went to a neurology evaluation and was diagnosed with ocular migraine.  He was cleared to return to flying.  We do not have any records of these evaluations.  Over the years patient continued to have about 2-4 episodes per month lasting 15 to 20 minutes.  In the last 3 to 4 years these have reduced to about 1/month.  In the last year he has not had any episodes since approximately summer 2024.  Over the years when he has had these episodes they do not interfere with his ability to function, drive or fly.  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  He is interested in submitting his FAA application so he is able to get cleared to fly again.         REVIEW OF SYSTEMS: Full 14 system review of systems performed and negative with exception of: as per  HPI.  ALLERGIES: Allergies  Allergen Reactions   Lisinopril Itching    HOME MEDICATIONS: Outpatient Medications Prior to Visit  Medication Sig Dispense Refill   amLODipine  (NORVASC ) 10 MG tablet TAKE 1 TABLET DAILY 90 tablet 3   aspirin  EC 81 MG tablet Take 1 tablet (81 mg total) by mouth daily. Swallow whole. 30 tablet 11   Cholecalciferol (VITAMIN D3) 50 MCG (2000 UT) capsule Take 2,000 Units by mouth daily.     ezetimibe  (ZETIA ) 10 MG tablet Take 1 tablet (10 mg total) by mouth daily. 90 tablet 3   losartan  (COZAAR ) 50 MG tablet TAKE 1 TABLET DAILY AS INSTRUCTED BY YOUR PRESCRIBER 90 tablet 3   pravastatin  (PRAVACHOL ) 80 MG tablet TAKE 1 TABLET DAILY AS INSTRUCTED BY YOUR PRESCRIBER 90 tablet 3   No facility-administered medications prior to visit.    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   42 mm   Complication of anesthesia    Vaso-vagals down after procedures   Elevated troponin    Hyperlipidemia    on meds   Hypertension    on meds   Mild left ventricular hypertrophy 05/07/2020   Mild mitral regurgitation 05/07/2020    PAST SURGICAL HISTORY: Past Surgical History:  Procedure Laterality Date   ANTERIOR CERVICAL DECOMP/DISCECTOMY FUSION N/A 02/28/2021   Procedure: ANTERIOR  CERVICAL DECOMPRESSION FUSION CERVICAL 5- CERVICAL 6, CERVICAL 6- CERVICAL 7 WITH INSTRUMENTATION AND ALLOGRAFT;  Surgeon: Beuford Anes, MD;  Location: [facility] OR;  Service: Orthopedics;  Laterality: N/A;   back fusion  2007   INGUINAL HERNIA REPAIR Right 2024   KNEE CARTILAGE SURGERY Bilateral    1978   KNEE SURGERY Left 07/2023   SHOULDER SURGERY Right 2021   WISDOM TOOTH EXTRACTION      FAMILY HISTORY: Family History  Problem Relation Age of Onset   Stomach cancer Neg Hx    Rectal cancer Neg Hx    Esophageal cancer Neg Hx    Colon cancer Neg Hx    Colon polyps Neg Hx     SOCIAL HISTORY: Social  History   Socioeconomic History   Marital status: Married    Spouse name: Not on file   Number of children: 3   Years of education: Not on file   Highest education level: Not on file  Occupational History   Occupation: Chemical engineer  Tobacco Use   Smoking status: Former    Types: Cigars   Smokeless tobacco: Never  Vaping Use   Vaping status: Never Used  Substance and Sexual Activity   Alcohol use: Yes    Alcohol/week: 14.0 standard drinks of alcohol    Types: 14 Standard drinks or equivalent per week   Drug use: No   Sexual activity: Not on file  Other Topics Concern   Not on file  Social History Narrative   Lives with wife.     Social Drivers of Corporate investment banker Strain: Not on file  Food Insecurity: Not on file  Transportation Needs: Not on file  Physical Activity: Not on file  Stress: Not on file  Social Connections: Not on file  Intimate Partner Violence: Not on file     PHYSICAL EXAM  GENERAL EXAM/CONSTITUTIONAL: Vitals:  Vitals:   09/07/23 0849  BP: 129/87  Pulse: 70  Weight: 202 lb (91.6 kg)  Height: 5' 10 (1.778 m)   Body mass index is 28.98 kg/m. Wt Readings from Last 3 Encounters:  09/07/23 202 lb (91.6 kg)  05/01/23 204 lb 3.2 oz (92.6 kg)  10/30/22 195 lb (88.5 kg)   Patient is in no distress; well developed, nourished and groomed; neck is supple  CARDIOVASCULAR: Examination of carotid arteries is normal; no carotid bruits Regular rate and rhythm, no murmurs Examination of peripheral vascular system by observation and palpation is normal  EYES: Ophthalmoscopic exam of optic discs and posterior segments is normal; no papilledema or hemorrhages No results found.  MUSCULOSKELETAL: Gait, strength, tone, movements noted in Neurologic exam below  NEUROLOGIC: MENTAL STATUS:      No data to display            09/09/2023   10:17 AM  Montreal Cognitive Assessment   Visuospatial/ Executive (0/5) 5  Naming (0/3) 3   Attention: Read list of digits (0/2) 2  Attention: Read list of letters (0/1) 1  Attention: Serial 7 subtraction starting at 100 (0/3) 3  Language: Repeat phrase (0/2) 2  Language : Fluency (0/1) 1  Abstraction (0/2) 2  Delayed Recall (0/5) 4  Orientation (0/6) 6  Total 29    awake, alert, oriented to person, place and time recent and remote memory intact normal attention and concentration language fluent, comprehension intact, naming intact fund of knowledge appropriate  CRANIAL NERVE:  2nd - no papilledema on fundoscopic exam 2nd, 3rd, 4th, 6th - pupils  equal and reactive to light, visual fields full to confrontation, extraocular muscles intact, no nystagmus 5th - facial sensation symmetric 7th - facial strength symmetric 8th - hearing intact 9th - palate elevates symmetrically, uvula midline 11th - shoulder shrug symmetric 12th - tongue protrusion midline  MOTOR:  normal bulk and tone, full strength in the BUE, BLE  SENSORY:  normal and symmetric to light touch, temperature, vibration  COORDINATION:  finger-nose-finger, fine finger movements normal  REFLEXES:  deep tendon reflexes 1+ and symmetric  GAIT/STATION:  narrow based gait; ROMBERG NEGATIVE     DIAGNOSTIC DATA (LABS, IMAGING, TESTING) - I reviewed patient records, labs, notes, testing and imaging myself where available.  Lab Results  Component Value Date   WBC 4.6 02/28/2022   HGB 15.7 02/28/2022   HCT 48.1 02/28/2022   MCV 93 02/28/2022   PLT 308 02/28/2022      Component Value Date/Time   NA 139 02/28/2022 1133   K 5.0 02/28/2022 1133   CL 101 02/28/2022 1133   CO2 24 02/28/2022 1133   GLUCOSE 93 02/28/2022 1133   GLUCOSE 102 (H) 02/26/2021 1427   BUN 17 02/28/2022 1133   CREATININE 1.05 02/28/2022 1133   CALCIUM 9.6 02/28/2022 1133   PROT 6.6 07/09/2017 1028   ALBUMIN 4.5 07/09/2017 1028   AST 20 07/09/2017 1028   ALT 21 07/09/2017 1028   ALKPHOS 50 07/09/2017 1028   BILITOT 0.7  07/09/2017 1028   GFRNONAA >60 02/26/2021 1427   GFRAA 86 09/17/2018 1410   Lab Results  Component Value Date   CHOL 150 04/08/2022   HDL 71 04/08/2022   LDLCALC 64 04/08/2022   TRIG 80 04/08/2022   CHOLHDL 2.1 04/08/2022   No results found for: HGBA1C No results found for: [id number] Lab Results  Component Value Date   TSH 1.390 02/28/2022    \  ASSESSMENT AND PLAN  63 y.o. year old male here with:   Dx:  1. Acephalgic migraine   2. Transient vision disturbance of both eyes     PLAN:  VISUAL MIGRAINE AURA WITHOUT HEADACHE (also known as acephalgic migraine; symptoms affect both eyes and therefore this is more accurate diagnosis than previous ocular migraine diagnosis; prognosis and management remain the same; since ~2005; none since summer 2024; previously 0-1 per month in prior 3-4 years; previously 2-4 per month) -Neurologic examination is normal -No headaches ever since onset of visual symptoms in 2005 -Symptoms lasted 15 to 20 minutes at a time.  Last event summer 2024 -Symptoms are not incapacitating. -Patient has slight visual scotoma fortification spectrum, consistent with classic migraine visual aura  - check MRI brain  - no further migraine treatments recommended at this time  Orders Placed This Encounter  Procedures   MR BRAIN W WO CONTRAST   Return for pending if symptoms worsen or fail to improve, return to PCP.    EDUARD FABIENE HANLON, MD 09/07/2023, 5:29 PM Certified in Neurology, Neurophysiology and Neuroimaging  [facility] [address], [address] [place], [place] [address] [phone]

## 2023-09-11 ENCOUNTER — Encounter (HOSPITAL_BASED_OUTPATIENT_CLINIC_OR_DEPARTMENT_OTHER): Payer: Self-pay | Admitting: Family Medicine

## 2023-09-11 DIAGNOSIS — Z125 Encounter for screening for malignant neoplasm of prostate: Secondary | ICD-10-CM

## 2023-09-11 DIAGNOSIS — Z0000 Encounter for general adult medical examination without abnormal findings: Secondary | ICD-10-CM

## 2023-09-11 DIAGNOSIS — R6882 Decreased libido: Secondary | ICD-10-CM

## 2023-09-15 ENCOUNTER — Ambulatory Visit

## 2023-09-15 DIAGNOSIS — H539 Unspecified visual disturbance: Secondary | ICD-10-CM

## 2023-09-15 MED ORDER — GADOBENATE DIMEGLUMINE 529 MG/ML IV SOLN
20.0000 mL | Freq: Once | INTRAVENOUS | Status: AC | PRN
  Administered 2023-09-15: 20 mL via INTRAVENOUS

## 2023-09-17 ENCOUNTER — Ambulatory Visit: Payer: Self-pay | Admitting: Diagnostic Neuroimaging

## 2023-09-18 ENCOUNTER — Other Ambulatory Visit: Payer: Self-pay | Admitting: Medical Genetics

## 2023-09-18 LAB — CBC WITH DIFFERENTIAL/PLATELET
Basophils Absolute: 0 x10E3/uL (ref 0.0–0.2)
Basos: 1 %
EOS (ABSOLUTE): 0.1 x10E3/uL (ref 0.0–0.4)
Eos: 2 %
Hematocrit: 47 % (ref 37.5–51.0)
Hemoglobin: 15.4 g/dL (ref 13.0–17.7)
Immature Grans (Abs): 0 x10E3/uL (ref 0.0–0.1)
Immature Granulocytes: 0 %
Lymphocytes Absolute: 1.9 x10E3/uL (ref 0.7–3.1)
Lymphs: 38 %
MCH: 31.3 pg (ref 26.6–33.0)
MCHC: 32.8 g/dL (ref 31.5–35.7)
MCV: 96 fL (ref 79–97)
Monocytes Absolute: 0.4 x10E3/uL (ref 0.1–0.9)
Monocytes: 9 %
Neutrophils Absolute: 2.5 x10E3/uL (ref 1.4–7.0)
Neutrophils: 50 %
Platelets: 278 x10E3/uL (ref 150–450)
RBC: 4.92 x10E6/uL (ref 4.14–5.80)
RDW: 13.5 % (ref 11.6–15.4)
WBC: 4.9 x10E3/uL (ref 3.4–10.8)

## 2023-09-18 LAB — COMPREHENSIVE METABOLIC PANEL WITH GFR
ALT: 28 IU/L (ref 0–44)
AST: 24 IU/L (ref 0–40)
Albumin: 4.5 g/dL (ref 3.9–4.9)
Alkaline Phosphatase: 58 IU/L (ref 44–121)
BUN/Creatinine Ratio: 17 (ref 10–24)
BUN: 18 mg/dL (ref 8–27)
Bilirubin Total: 0.7 mg/dL (ref 0.0–1.2)
CO2: 21 mmol/L (ref 20–29)
Calcium: 9.4 mg/dL (ref 8.6–10.2)
Chloride: 100 mmol/L (ref 96–106)
Creatinine, Ser: 1.05 mg/dL (ref 0.76–1.27)
Globulin, Total: 2.2 g/dL (ref 1.5–4.5)
Glucose: 94 mg/dL (ref 70–99)
Potassium: 4.5 mmol/L (ref 3.5–5.2)
Sodium: 138 mmol/L (ref 134–144)
Total Protein: 6.7 g/dL (ref 6.0–8.5)
eGFR: 80 mL/min/1.73 (ref >59)

## 2023-09-18 LAB — TESTOSTERONE: Testosterone: 653 ng/dL (ref 264–916)

## 2023-09-18 LAB — LIPID PANEL
Chol/HDL Ratio: 2.4 ratio (ref 0.0–5.0)
Cholesterol, Total: 179 mg/dL (ref 100–199)
HDL: 75 mg/dL
LDL Chol Calc (NIH): 89 mg/dL (ref 0–99)
Triglycerides: 79 mg/dL (ref 0–149)
VLDL Cholesterol Cal: 15 mg/dL (ref 5–40)

## 2023-09-18 LAB — PSA TOTAL (REFLEX TO FREE): Prostate Specific Ag, Serum: 2.9 ng/mL (ref 0.0–4.0)

## 2023-09-18 LAB — HEMOGLOBIN A1C
Est. average glucose Bld gHb Est-mCnc: 111 mg/dL
Hgb A1c MFr Bld: 5.5 % (ref 4.8–5.6)

## 2023-09-18 LAB — TSH RFX ON ABNORMAL TO FREE T4: TSH: 1.89 u[IU]/mL (ref 0.450–4.500)

## 2023-10-06 ENCOUNTER — Ambulatory Visit (INDEPENDENT_AMBULATORY_CARE_PROVIDER_SITE_OTHER): Admitting: Family Medicine

## 2023-10-06 ENCOUNTER — Encounter (HOSPITAL_BASED_OUTPATIENT_CLINIC_OR_DEPARTMENT_OTHER): Payer: Self-pay | Admitting: Family Medicine

## 2023-10-06 VITALS — BP 128/87 | HR 72 | Ht 70.0 in | Wt 202.5 lb

## 2023-10-06 DIAGNOSIS — M545 Low back pain, unspecified: Secondary | ICD-10-CM | POA: Insufficient documentation

## 2023-10-06 DIAGNOSIS — L814 Other melanin hyperpigmentation: Secondary | ICD-10-CM | POA: Insufficient documentation

## 2023-10-06 DIAGNOSIS — R21 Rash and other nonspecific skin eruption: Secondary | ICD-10-CM | POA: Insufficient documentation

## 2023-10-06 DIAGNOSIS — H524 Presbyopia: Secondary | ICD-10-CM | POA: Insufficient documentation

## 2023-10-06 DIAGNOSIS — Z8619 Personal history of other infectious and parasitic diseases: Secondary | ICD-10-CM | POA: Insufficient documentation

## 2023-10-06 DIAGNOSIS — Z0000 Encounter for general adult medical examination without abnormal findings: Secondary | ICD-10-CM | POA: Diagnosis not present

## 2023-10-06 DIAGNOSIS — M799 Soft tissue disorder, unspecified: Secondary | ICD-10-CM | POA: Insufficient documentation

## 2023-10-06 DIAGNOSIS — R0689 Other abnormalities of breathing: Secondary | ICD-10-CM | POA: Insufficient documentation

## 2023-10-06 DIAGNOSIS — M25539 Pain in unspecified wrist: Secondary | ICD-10-CM | POA: Insufficient documentation

## 2023-10-06 DIAGNOSIS — Z98811 Dental restoration status: Secondary | ICD-10-CM | POA: Insufficient documentation

## 2023-10-06 DIAGNOSIS — N529 Male erectile dysfunction, unspecified: Secondary | ICD-10-CM | POA: Insufficient documentation

## 2023-10-06 DIAGNOSIS — M7590 Shoulder lesion, unspecified, unspecified shoulder: Secondary | ICD-10-CM | POA: Insufficient documentation

## 2023-10-06 DIAGNOSIS — H52229 Regular astigmatism, unspecified eye: Secondary | ICD-10-CM | POA: Insufficient documentation

## 2023-10-06 DIAGNOSIS — Z136 Encounter for screening for cardiovascular disorders: Secondary | ICD-10-CM | POA: Insufficient documentation

## 2023-10-06 DIAGNOSIS — L578 Other skin changes due to chronic exposure to nonionizing radiation: Secondary | ICD-10-CM | POA: Insufficient documentation

## 2023-10-06 DIAGNOSIS — R351 Nocturia: Secondary | ICD-10-CM | POA: Insufficient documentation

## 2023-10-06 DIAGNOSIS — G43109 Migraine with aura, not intractable, without status migrainosus: Secondary | ICD-10-CM | POA: Insufficient documentation

## 2023-10-06 DIAGNOSIS — H449 Unspecified disorder of globe: Secondary | ICD-10-CM | POA: Insufficient documentation

## 2023-10-06 DIAGNOSIS — L219 Seborrheic dermatitis, unspecified: Secondary | ICD-10-CM | POA: Insufficient documentation

## 2023-10-06 DIAGNOSIS — D2262 Melanocytic nevi of left upper limb, including shoulder: Secondary | ICD-10-CM | POA: Insufficient documentation

## 2023-10-06 DIAGNOSIS — R55 Syncope and collapse: Secondary | ICD-10-CM | POA: Insufficient documentation

## 2023-10-06 DIAGNOSIS — E782 Mixed hyperlipidemia: Secondary | ICD-10-CM | POA: Insufficient documentation

## 2023-10-06 DIAGNOSIS — M25519 Pain in unspecified shoulder: Secondary | ICD-10-CM | POA: Insufficient documentation

## 2023-10-06 DIAGNOSIS — L821 Other seborrheic keratosis: Secondary | ICD-10-CM | POA: Insufficient documentation

## 2023-10-06 DIAGNOSIS — M5416 Radiculopathy, lumbar region: Secondary | ICD-10-CM | POA: Insufficient documentation

## 2023-10-06 DIAGNOSIS — D485 Neoplasm of uncertain behavior of skin: Secondary | ICD-10-CM | POA: Insufficient documentation

## 2023-10-06 DIAGNOSIS — D225 Melanocytic nevi of trunk: Secondary | ICD-10-CM | POA: Insufficient documentation

## 2023-10-06 DIAGNOSIS — R200 Anesthesia of skin: Secondary | ICD-10-CM | POA: Insufficient documentation

## 2023-10-06 MED ORDER — TAMSULOSIN HCL 0.4 MG PO CAPS
0.4000 mg | ORAL_CAPSULE | Freq: Every day | ORAL | 3 refills | Status: AC
Start: 2023-10-06 — End: ?

## 2023-10-06 NOTE — Patient Instructions (Signed)
  Medication Instructions:  Your physician recommends that you continue on your current medications as directed. Please refer to the Current Medication list given to you today. --If you need a refill on any your medications before your next appointment, please call your pharmacy first. If no refills are authorized on file call the office.--   Follow-Up: Your next appointment:   Your physician recommends that you schedule a follow-up appointment in: 6-8 week follow up  with Dr. de Peru  You will receive a text message or e-mail with a link to a survey about your care and experience with [place] today! We would greatly appreciate your feedback!   Thanks for letting us be apart of your health journey!!  Primary Care and Sports Medicine   Dr. Ceasar Mons Peru   We encourage you to activate your patient portal called "MyChart".  Sign up information is provided on this After Visit Summary.  MyChart is used to connect with patients for Virtual Visits (Telemedicine).  Patients are able to view lab/test results, encounter notes, upcoming appointments, etc.  Non-urgent messages can be sent to your provider as well. To learn more about what you can do with MyChart, please visit --  [email or web address].

## 2023-10-06 NOTE — Progress Notes (Signed)
 Subjective:    CC: Annual Physical Exam  HPI:  Gregory Hunter is a 63 y.o. presenting for annual physical  I reviewed the past medical history, family history, social history, surgical history, and allergies today and no changes were needed.  Please see the problem list section below in epic for further details.  Past Medical History: Past Medical History:  Diagnosis Date   Abnormal EKG    Allergy ?   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   42 mm   Complication of anesthesia    Vaso-vagals down after procedures   Elevated troponin    Hyperlipidemia    on meds   Hypertension    on meds   Mild left ventricular hypertrophy 05/07/2020   Mild mitral regurgitation 05/07/2020   Past Surgical History: Past Surgical History:  Procedure Laterality Date   ANTERIOR CERVICAL DECOMP/DISCECTOMY FUSION N/A 02/28/2021   Procedure: ANTERIOR CERVICAL DECOMPRESSION FUSION CERVICAL 5- CERVICAL 6, CERVICAL 6- CERVICAL 7 WITH INSTRUMENTATION AND ALLOGRAFT;  Surgeon: Beuford Anes, MD;  Location: [facility] OR;  Service: Orthopedics;  Laterality: N/A;   back fusion  2007   HERNIA REPAIR  05/2022   INGUINAL HERNIA REPAIR Right 2024   KNEE CARTILAGE SURGERY Bilateral    1978   KNEE SURGERY Left 07/2023   SHOULDER SURGERY Right 2021   SPINE SURGERY  2007 2023   L4-L5 Fusion.   C4-C6 Fusion   WISDOM TOOTH EXTRACTION     Social History: Social History   Socioeconomic History   Marital status: Married    Spouse name: Not on file   Number of children: 3   Years of education: Not on file   Highest education level: Not on file  Occupational History   Occupation: Chemical engineer  Tobacco Use   Smoking status: Former    Types: Cigars    Passive exposure: Past   Smokeless tobacco: Never  Vaping Use   Vaping status: Never Used  Substance and Sexual Activity   Alcohol use: Yes    Alcohol/week: 14.0 standard drinks of alcohol    Types: 14 Standard drinks or equivalent per week    Drug use: Never   Sexual activity: Yes    Birth control/protection: None  Other Topics Concern   Not on file  Social History Narrative   Lives with wife.     Social Drivers of Health   Financial Resource Strain: Patient Declined (10/05/2023)   Overall Financial Resource Strain (CARDIA)    Difficulty of Paying Living Expenses: Patient declined  Food Insecurity: Patient Declined (10/05/2023)   Hunger Vital Sign    Worried About Running Out of Food in the Last Year: Patient declined    Ran Out of Food in the Last Year: Patient declined  Transportation Needs: Patient Declined (10/05/2023)   PRAPARE - Administrator, Civil Service (Medical): Patient declined    Lack of Transportation (Non-Medical): Patient declined  Physical Activity: Sufficiently Active (10/05/2023)   Exercise Vital Sign    Days of Exercise per Week: 5 days    Minutes of Exercise per Session: 30 min  Stress: Stress Concern Present (10/05/2023)   [employer] of Occupational Health - Occupational Stress Questionnaire    Feeling of Stress: To some extent  Social Connections: Unknown (10/05/2023)   Social Connection and Isolation Panel    Frequency of Communication with Friends and Family: Patient declined    Frequency of Social Gatherings with Friends and Family: Patient declined  Attends Religious Services: Patient declined    Active Member of Clubs or Organizations: Not on file    Attends Banker Meetings: Not on file    Marital Status: Patient declined   Family History: Family History  Problem Relation Age of Onset   Hyperlipidemia Mother    Hypertension Mother    Hyperlipidemia Father    Hypertension Father    Stomach cancer Neg Hx    Rectal cancer Neg Hx    Esophageal cancer Neg Hx    Colon cancer Neg Hx    Colon polyps Neg Hx    Allergies: Allergies  Allergen Reactions   Lisinopril Itching   Medications: See med rec.  Review of Systems: No headache, visual changes,  nausea, vomiting, diarrhea, constipation, dizziness, abdominal pain, skin rash, fevers, chills, night sweats, swollen lymph nodes, weight loss, chest pain, body aches, joint swelling, muscle aches, shortness of breath, mood changes, visual or auditory hallucinations.  Objective:    BP 128/87 (BP Location: Right Arm, Patient Position: Sitting, Cuff Size: Normal)   Pulse 72   Ht 5' 10 (1.778 m)   Wt 202 lb 8 oz (91.9 kg)   SpO2 93%   BMI 29.06 kg/m   General: Well Developed, well nourished, and in no acute distress. Neuro: Alert and oriented x3, extra-ocular muscles intact, sensation grossly intact. Cranial nerves II through XII are intact, motor, sensory, and coordinative functions are all intact. HEENT: Normocephalic, atraumatic, pupils equal round reactive to light, neck supple, no masses, no lymphadenopathy, thyroid nonpalpable. Oropharynx, nasopharynx, external ear canals are unremarkable. Skin: Warm and dry, no rashes noted. Cardiac: Regular rate and rhythm, no murmurs rubs or gallops. Respiratory: Clear to auscultation bilaterally. Not using accessory muscles, speaking in full sentences. Abdominal: Soft, nontender, nondistended, positive bowel sounds, no masses, no organomegaly. Musculoskeletal: Shoulder, elbow, wrist, hip, knee, ankle stable, and with full range of motion.  Impression and Recommendations:    Wellness examination Assessment & Plan: Routine HCM labs reviewed. HCM reviewed/discussed. Anticipatory guidance regarding healthy weight, lifestyle and choices given. Recommend healthy diet.  Recommend approximately 150 minutes/week of moderate intensity exercise Recommend regular dental and vision exams Always use seatbelt/lap and shoulder restraints Recommend using smoke alarms and checking batteries at least twice a year Recommend using sunscreen when outside Discussed colon cancer screening recommendations, options.  Patient UTD Discussed recommendations for shingles  vaccine.  Patient will consider Discussed immunization recommendations   Nocturia Assessment & Plan: Somewhat of a new issue for patient.  More so this has been affecting his sleep at night.  He finds that he will be waking up through the night to have to use the bathroom.  Feels that during the night he will have more issues related to urinary flow with decreased flow rate/stream.  No issues with pain or burning with urination.  Denies significant daytime issues or symptoms.  Has not noted any other changes such as hematuria. We discussed considerations.  We reviewed potential lifestyle modifications/changes.  We discussed timing of fluids/water intake in the evening and not drinking too much fluids closer to bedtime.  We also discussed caffeine use in the afternoon and evening and how this could affect symptoms, he reports that he generally does not have any caffeine consumption in the afternoon.  We also discussed potential association with alcohol intake, he does occasionally have alcoholic beverage with dinner and then after dinner as well.  We discussed potential adjustments in this as alcohol can also trigger increased urination.  We discussed potential medication options.  He would be open to trial of max, we discussed potential risks and benefits with this medication.  Will start with low-dose of Flomax  and we can monitor response at follow-up appointment in about 6 weeks   Other orders -     Tamsulosin  HCl; Take 1 capsule (0.4 mg total) by mouth daily.  Dispense: 30 capsule; Refill: 3  Return in about 6 weeks (around 11/17/2023) for med check.   ___________________________________________ Waris Rodger de [place], MD, ABFM, [facility]

## 2023-10-06 NOTE — Assessment & Plan Note (Signed)
 Routine HCM labs reviewed. HCM reviewed/discussed. Anticipatory guidance regarding healthy weight, lifestyle and choices given. Recommend healthy diet.  Recommend approximately 150 minutes/week of moderate intensity exercise Recommend regular dental and vision exams Always use seatbelt/lap and shoulder restraints Recommend using smoke alarms and checking batteries at least twice a year Recommend using sunscreen when outside Discussed colon cancer screening recommendations, options.  Patient UTD Discussed recommendations for shingles vaccine.  Patient will consider Discussed immunization recommendations

## 2023-10-06 NOTE — Assessment & Plan Note (Signed)
 Somewhat of a new issue for patient.  More so this has been affecting his sleep at night.  He finds that he will be waking up through the night to have to use the bathroom.  Feels that during the night he will have more issues related to urinary flow with decreased flow rate/stream.  No issues with pain or burning with urination.  Denies significant daytime issues or symptoms.  Has not noted any other changes such as hematuria. We discussed considerations.  We reviewed potential lifestyle modifications/changes.  We discussed timing of fluids/water intake in the evening and not drinking too much fluids closer to bedtime.  We also discussed caffeine use in the afternoon and evening and how this could affect symptoms, he reports that he generally does not have any caffeine consumption in the afternoon.  We also discussed potential association with alcohol intake, he does occasionally have alcoholic beverage with dinner and then after dinner as well.  We discussed potential adjustments in this as alcohol can also trigger increased urination.  We discussed potential medication options.  He would be open to trial of max, we discussed potential risks and benefits with this medication.  Will start with low-dose of Flomax  and we can monitor response at follow-up appointment in about 6 weeks

## 2023-10-13 ENCOUNTER — Other Ambulatory Visit (HOSPITAL_COMMUNITY)
Admission: RE | Admit: 2023-10-13 | Discharge: 2023-10-13 | Disposition: A | Discharge status: Home / Self Care | Payer: Self-pay | Source: Ambulatory Visit | Attending: Medical Genetics | Admitting: Medical Genetics

## 2023-10-19 LAB — GENECONNECT MOLECULAR SCREEN: Genetic Analysis Overall Interpretation: NEGATIVE

## 2023-11-06 ENCOUNTER — Other Ambulatory Visit: Payer: Self-pay | Admitting: Cardiology

## 2023-11-24 ENCOUNTER — Ambulatory Visit (HOSPITAL_BASED_OUTPATIENT_CLINIC_OR_DEPARTMENT_OTHER): Admitting: Family Medicine

## 2024-03-14 ENCOUNTER — Ambulatory Visit (HOSPITAL_COMMUNITY)
Admission: RE | Admit: 2024-03-14 | Discharge: 2024-03-14 | Disposition: A | Source: Ambulatory Visit | Attending: Cardiology | Admitting: Cardiology

## 2024-03-14 ENCOUNTER — Ambulatory Visit: Payer: Self-pay | Admitting: Cardiology

## 2024-03-14 DIAGNOSIS — I340 Nonrheumatic mitral (valve) insufficiency: Secondary | ICD-10-CM

## 2024-03-14 MED ORDER — IOHEXOL 350 MG/ML SOLN
75.0000 mL | Freq: Once | INTRAVENOUS | Status: AC | PRN
  Administered 2024-03-14: 75 mL via INTRAVENOUS

## 2024-03-18 ENCOUNTER — Ambulatory Visit (HOSPITAL_COMMUNITY): Admission: RE | Admit: 2024-03-18

## 2024-03-18 ENCOUNTER — Encounter (HOSPITAL_BASED_OUTPATIENT_CLINIC_OR_DEPARTMENT_OTHER): Payer: Self-pay

## 2024-03-18 DIAGNOSIS — I340 Nonrheumatic mitral (valve) insufficiency: Secondary | ICD-10-CM

## 2024-03-18 LAB — ECHOCARDIOGRAM COMPLETE
Area-P 1/2: 3.19 cm2
MV M vel: 5.14 m/s
MV Peak grad: 105.7 mmHg
Radius: 0.8 cm
S' Lateral: 2.4 cm

## 2024-05-23 ENCOUNTER — Ambulatory Visit: Admitting: Cardiology
